# Patient Record
Sex: Male | Born: 1985 | Race: White | Hispanic: No | Marital: Single | Smoking: Never smoker
Health system: Southern US, Community
[De-identification: ages and names within clinical notes are randomized; demographics above are authoritative.]

## PROBLEM LIST (undated history)

## (undated) DIAGNOSIS — F913 Oppositional defiant disorder: Secondary | ICD-10-CM

## (undated) DIAGNOSIS — F909 Attention-deficit hyperactivity disorder, unspecified type: Secondary | ICD-10-CM

## (undated) DIAGNOSIS — F71 Moderate intellectual disabilities: Secondary | ICD-10-CM

---

## 2006-02-15 ENCOUNTER — Emergency Department: Payer: Self-pay | Admitting: Emergency Medicine

## 2008-02-28 ENCOUNTER — Emergency Department: Payer: Self-pay | Admitting: Emergency Medicine

## 2008-05-10 ENCOUNTER — Emergency Department: Payer: Self-pay | Admitting: Unknown Physician Specialty

## 2008-05-11 ENCOUNTER — Other Ambulatory Visit: Payer: Self-pay

## 2008-06-22 ENCOUNTER — Emergency Department: Payer: Self-pay | Admitting: Emergency Medicine

## 2008-07-26 ENCOUNTER — Emergency Department: Payer: Self-pay | Admitting: Emergency Medicine

## 2008-10-29 ENCOUNTER — Emergency Department: Payer: Self-pay | Admitting: Emergency Medicine

## 2009-09-26 ENCOUNTER — Emergency Department: Payer: Self-pay | Admitting: Emergency Medicine

## 2009-10-14 ENCOUNTER — Emergency Department: Payer: Self-pay | Admitting: Emergency Medicine

## 2009-10-17 ENCOUNTER — Emergency Department: Payer: Self-pay | Admitting: Emergency Medicine

## 2009-11-28 ENCOUNTER — Emergency Department: Payer: Self-pay | Admitting: Emergency Medicine

## 2009-12-20 ENCOUNTER — Emergency Department (HOSPITAL_BASED_OUTPATIENT_CLINIC_OR_DEPARTMENT_OTHER): Admission: EM | Admit: 2009-12-20 | Discharge: 2009-12-20 | Payer: Self-pay | Admitting: Emergency Medicine

## 2010-06-29 ENCOUNTER — Emergency Department: Payer: Self-pay | Admitting: Emergency Medicine

## 2010-08-08 ENCOUNTER — Ambulatory Visit: Payer: Self-pay | Admitting: Urology

## 2010-12-02 ENCOUNTER — Emergency Department: Payer: Self-pay | Admitting: Emergency Medicine

## 2010-12-30 LAB — POCT TOXICOLOGY PANEL

## 2010-12-30 LAB — CARBOXYHEMOGLOBIN
Carboxyhemoglobin: 1.5 % (ref 0.5–1.5)
Methemoglobin: 0.8 % (ref 0.0–1.5)

## 2010-12-30 LAB — POCT I-STAT 3, ART BLOOD GAS (G3+)
pCO2 arterial: 38.5 mmHg (ref 35.0–45.0)
pH, Arterial: 7.39 (ref 7.350–7.450)
pO2, Arterial: 80 mmHg (ref 80.0–100.0)

## 2010-12-30 LAB — BASIC METABOLIC PANEL
GFR calc non Af Amer: >60 mL/min (ref >60)
Glucose, Bld: 101 mg/dL — ABNORMAL HIGH (ref 70–99)
Potassium: 3.9 mEq/L (ref 3.5–5.1)
Sodium: 148 mEq/L — ABNORMAL HIGH (ref 135–145)

## 2010-12-30 LAB — DIFFERENTIAL
Basophils Absolute: 0 10*3/uL (ref 0.0–0.1)
Eosinophils Relative: 0 % (ref 0–5)
Lymphocytes Relative: 24 % (ref 12–46)
Lymphs Abs: 2.3 10*3/uL (ref 0.7–4.0)
Monocytes Absolute: 0.8 10*3/uL (ref 0.1–1.0)

## 2010-12-30 LAB — CBC
HCT: 46.5 % (ref 39.0–52.0)
Hemoglobin: 16.3 g/dL (ref 13.0–17.0)
RDW: 13.2 % (ref 11.5–15.5)

## 2010-12-30 LAB — URINALYSIS, ROUTINE W REFLEX MICROSCOPIC
Ketones, ur: 15 mg/dL — AB
Nitrite: POSITIVE — AB
Protein, ur: NEGATIVE mg/dL
Urobilinogen, UA: 1 mg/dL (ref 0.0–1.0)

## 2010-12-30 LAB — HEPATIC FUNCTION PANEL
ALT: 30 U/L (ref 0–53)
AST: 30 U/L (ref 0–37)
Bilirubin, Direct: 0 mg/dL (ref 0.0–0.3)
Total Bilirubin: 0.8 mg/dL (ref 0.3–1.2)

## 2010-12-30 LAB — URINE MICROSCOPIC-ADD ON

## 2011-05-23 IMAGING — US US RENAL KIDNEY
1 series · 17 of 25 positions shown · non-contrast
Comparison: none

REASON FOR EXAM: frequency and incontinence
COMMENTS:

[Series 1: us renal kidney · 17 of 25 slices shown]
[im 1/25]
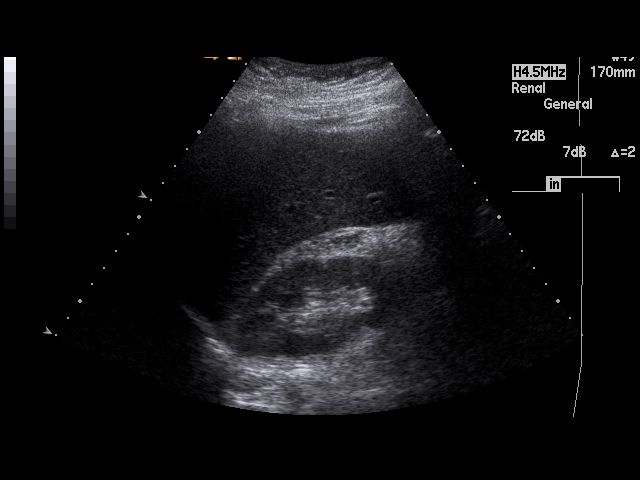
[im 3/25]
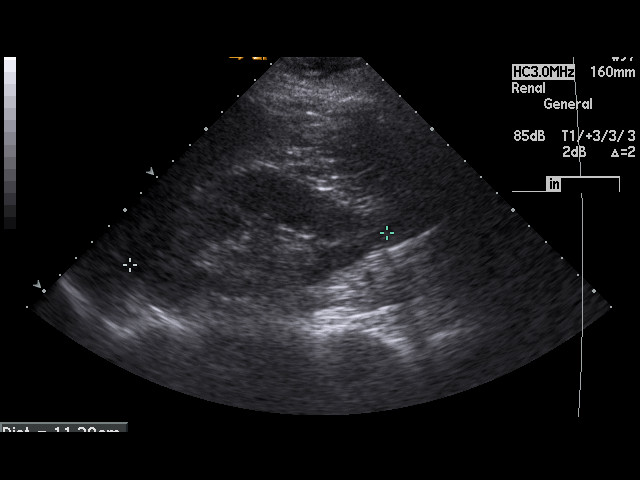
[im 4/25]
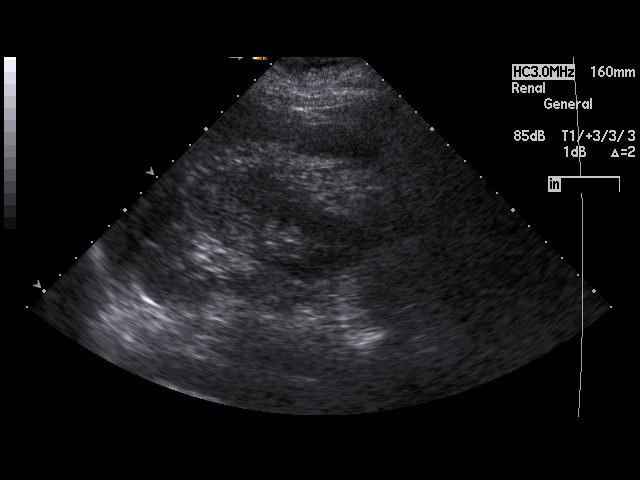
[im 6/25]
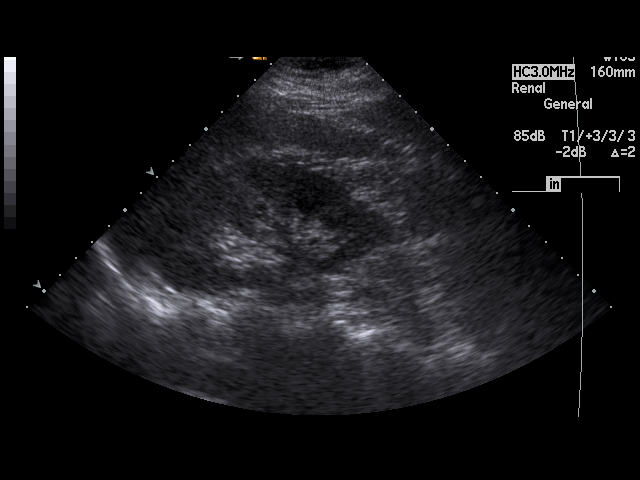
[im 7/25]
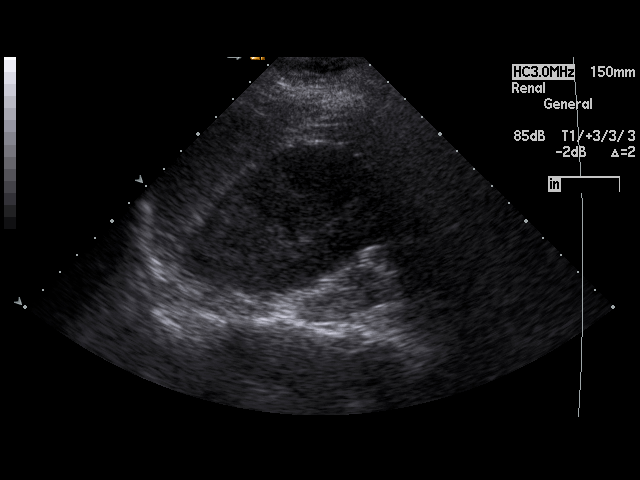
[im 9/25]
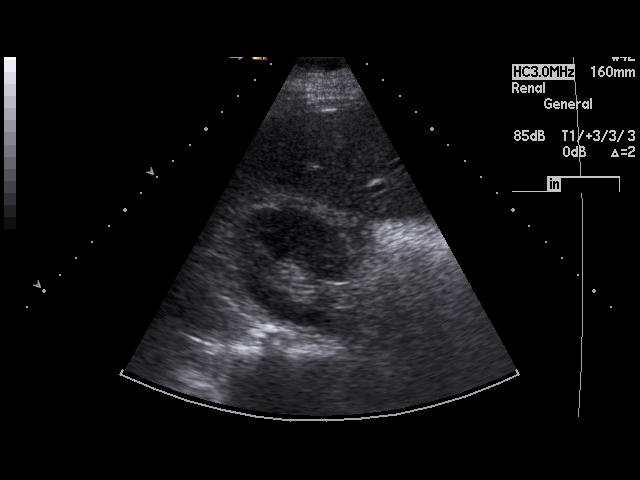
[im 10/25]
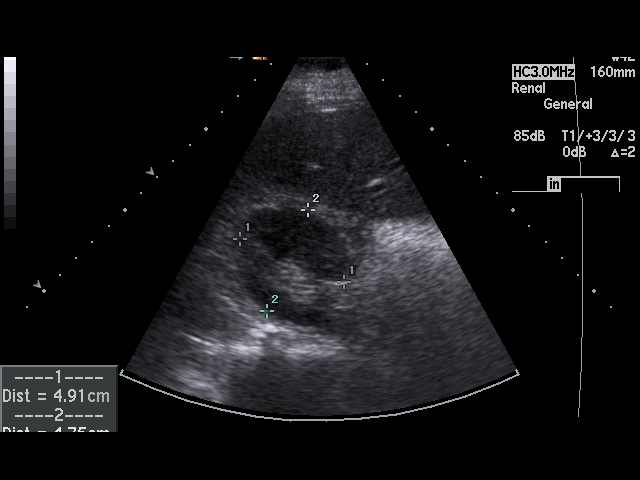
[im 12/25]
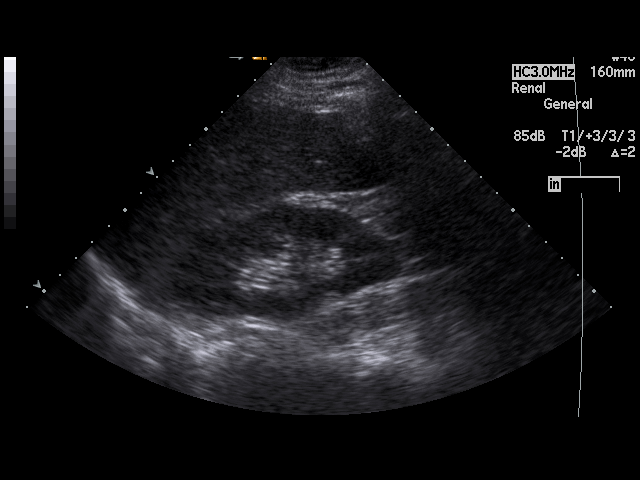
[im 13/25]
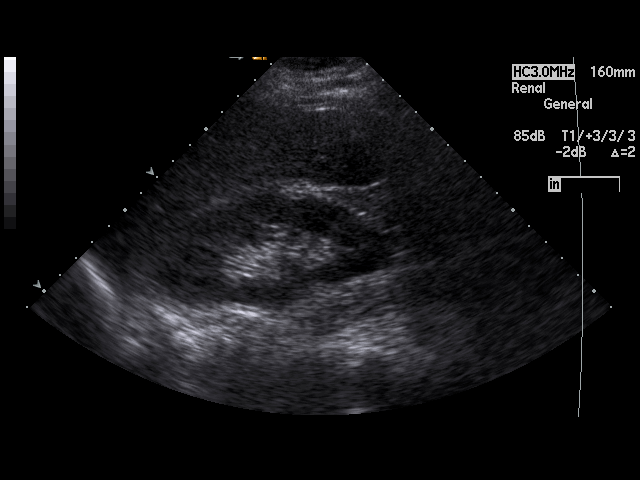
[im 14/25]
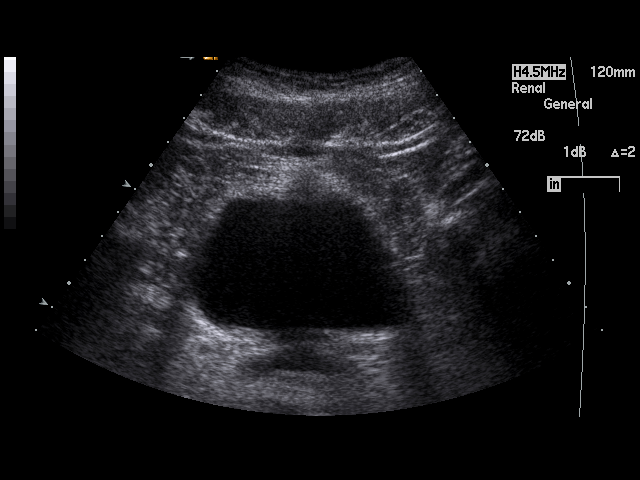
[im 16/25]
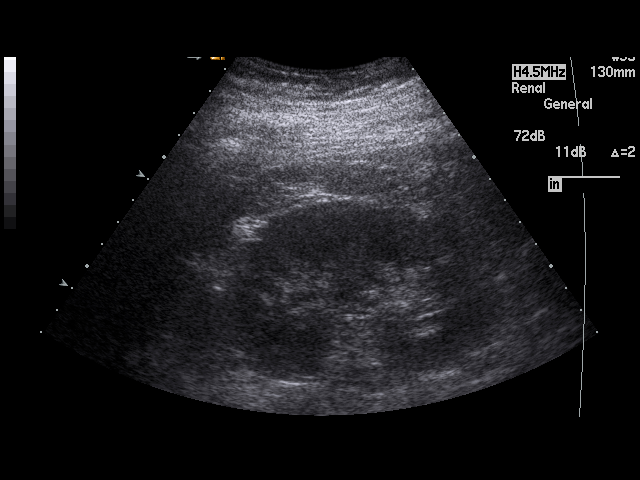
[im 17/25]
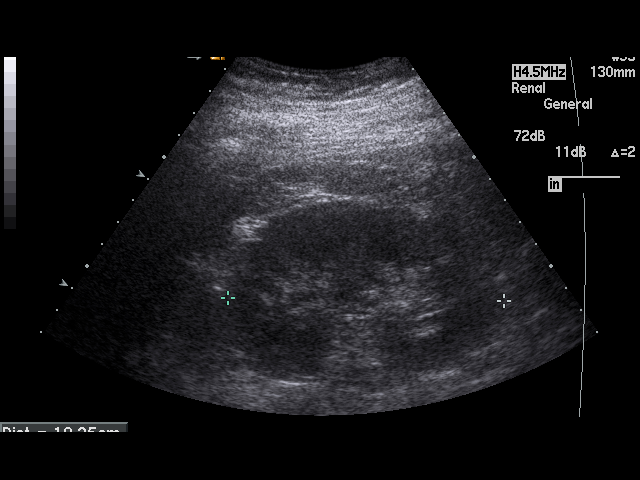
[im 19/25]
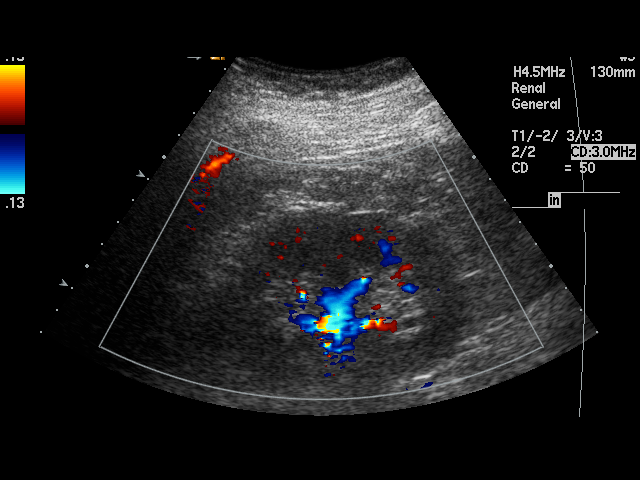
[im 20/25]
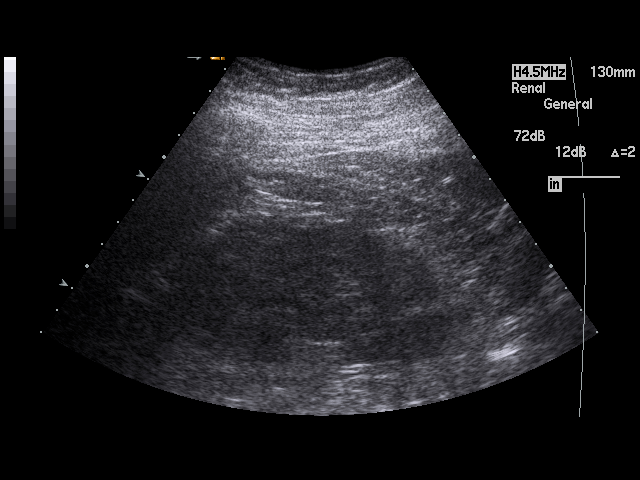
[im 22/25]
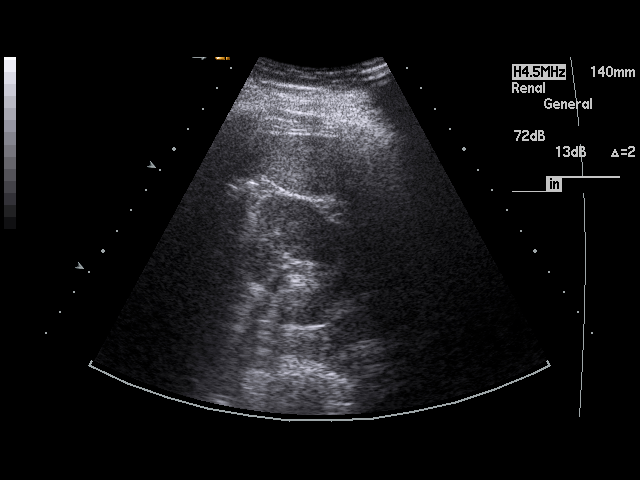
[im 23/25]
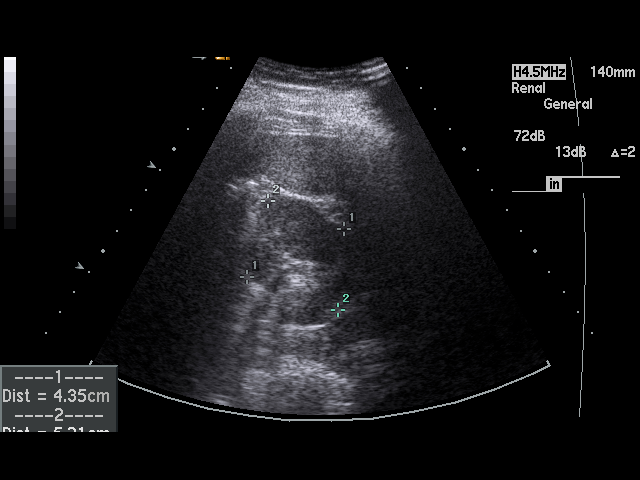
[im 25/25]
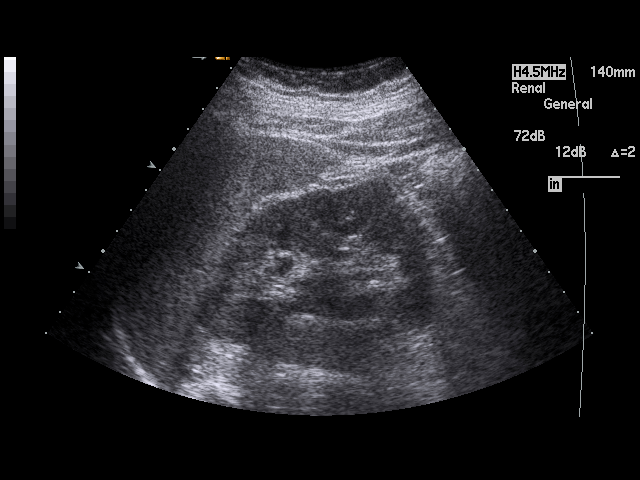

[17 of 25 positions shown; findings below may reference images not displayed]

PROCEDURE:     MESETRU - MESETRU KIDNEYS  - August 08, 2010  [DATE]

RESULT:     The right kidney measures 11.29 x 4.91 x 4.75 cm and the left
10.35 x 4.35 x 5.21 cm. There is appropriate corticomedullary
differentiation without evidence of hydronephrosis, masses or calculi. The
urinary bladder is partially distended with urine.
IMPRESSION: Unremarkable bilateral renal ultrasound.

## 2020-06-23 ENCOUNTER — Emergency Department
Admission: EM | Admit: 2020-06-23 | Discharge: 2020-06-25 | Disposition: A | Discharge status: Home / Self Care | Payer: Medicare Other | Attending: Emergency Medicine | Admitting: Emergency Medicine

## 2020-06-23 ENCOUNTER — Encounter: Payer: Self-pay | Admitting: Emergency Medicine

## 2020-06-23 ENCOUNTER — Other Ambulatory Visit: Payer: Self-pay

## 2020-06-23 DIAGNOSIS — R451 Restlessness and agitation: Secondary | ICD-10-CM | POA: Insufficient documentation

## 2020-06-23 DIAGNOSIS — F259 Schizoaffective disorder, unspecified: Secondary | ICD-10-CM | POA: Insufficient documentation

## 2020-06-23 DIAGNOSIS — Z79899 Other long term (current) drug therapy: Secondary | ICD-10-CM | POA: Diagnosis not present

## 2020-06-23 DIAGNOSIS — Z046 Encounter for general psychiatric examination, requested by authority: Secondary | ICD-10-CM | POA: Diagnosis present

## 2020-06-23 DIAGNOSIS — F6381 Intermittent explosive disorder: Secondary | ICD-10-CM

## 2020-06-23 DIAGNOSIS — F411 Generalized anxiety disorder: Secondary | ICD-10-CM

## 2020-06-23 HISTORY — DX: Attention-deficit hyperactivity disorder, unspecified type: F90.9

## 2020-06-23 HISTORY — DX: Moderate intellectual disabilities: F71

## 2020-06-23 HISTORY — DX: Oppositional defiant disorder: F91.3

## 2020-06-23 LAB — URINE DRUG SCREEN, QUALITATIVE (ARMC ONLY)
Amphetamines, Ur Screen: NOT DETECTED
Barbiturates, Ur Screen: NOT DETECTED
Benzodiazepine, Ur Scrn: NOT DETECTED
Cannabinoid 50 Ng, Ur ~~LOC~~: NOT DETECTED
Cocaine Metabolite,Ur ~~LOC~~: NOT DETECTED
MDMA (Ecstasy)Ur Screen: NOT DETECTED
Methadone Scn, Ur: NOT DETECTED
Opiate, Ur Screen: NOT DETECTED
Phencyclidine (PCP) Ur S: NOT DETECTED
Tricyclic, Ur Screen: NOT DETECTED

## 2020-06-23 LAB — CBC
HCT: 48.1 % (ref 39.0–52.0)
Hemoglobin: 17.3 g/dL — ABNORMAL HIGH (ref 13.0–17.0)
MCH: 31.6 pg (ref 26.0–34.0)
MCHC: 36 g/dL (ref 30.0–36.0)
MCV: 87.9 fL (ref 80.0–100.0)
Platelets: 188 10*3/uL (ref 150–400)
RBC: 5.47 MIL/uL (ref 4.22–5.81)
RDW: 12.4 % (ref 11.5–15.5)
WBC: 9.8 10*3/uL (ref 4.0–10.5)
nRBC: 0 % (ref 0.0–0.2)

## 2020-06-23 LAB — COMPREHENSIVE METABOLIC PANEL
ALT: 34 U/L (ref 0–44)
AST: 16 U/L (ref 15–41)
Albumin: 5.1 g/dL — ABNORMAL HIGH (ref 3.5–5.0)
Alkaline Phosphatase: 65 U/L (ref 38–126)
Anion gap: 12 (ref 5–15)
BUN: 19 mg/dL (ref 6–20)
CO2: 27 mmol/L (ref 22–32)
Calcium: 9.9 mg/dL (ref 8.9–10.3)
Chloride: 100 mmol/L (ref 98–111)
Creatinine, Ser: 0.88 mg/dL (ref 0.61–1.24)
GFR calc Af Amer: >60 mL/min (ref >60)
GFR calc non Af Amer: >60 mL/min (ref >60)
Glucose, Bld: 147 mg/dL — ABNORMAL HIGH (ref 70–99)
Potassium: 4.3 mmol/L (ref 3.5–5.1)
Sodium: 139 mmol/L (ref 135–145)
Total Bilirubin: 0.8 mg/dL (ref 0.3–1.2)
Total Protein: 8 g/dL (ref 6.5–8.1)

## 2020-06-23 LAB — ETHANOL: Alcohol, Ethyl (B): <10 mg/dL (ref <10)

## 2020-06-23 LAB — SALICYLATE LEVEL: Salicylate Lvl: <7 mg/dL — ABNORMAL LOW (ref 7.0–30.0)

## 2020-06-23 LAB — ACETAMINOPHEN LEVEL: Acetaminophen (Tylenol), Serum: <10 ug/mL — ABNORMAL LOW (ref 10–30)
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
## 2020-06-24 DIAGNOSIS — R451 Restlessness and agitation: Secondary | ICD-10-CM | POA: Diagnosis not present

## 2020-06-24 MED ORDER — DULOXETINE HCL 30 MG PO CPEP
30.0000 mg | ORAL_CAPSULE | Freq: Every day | ORAL | Status: DC
  Administered 2020-06-24 – 2020-06-25 (×2): 30 mg via ORAL
  Filled 2020-06-24 (×3): qty 1

## 2020-06-24 MED ORDER — CLONAZEPAM 0.5 MG PO TABS
0.5000 mg | ORAL_TABLET | Freq: Two times a day (BID) | ORAL | Status: DC
  Administered 2020-06-24 – 2020-06-25 (×3): 0.5 mg via ORAL
  Filled 2020-06-24 (×3): qty 1

## 2020-06-24 MED ORDER — TRIHEXYPHENIDYL HCL 2 MG PO TABS
2.0000 mg | ORAL_TABLET | Freq: Two times a day (BID) | ORAL | Status: DC
  Administered 2020-06-24 – 2020-06-25 (×2): 2 mg via ORAL
  Filled 2020-06-24 (×4): qty 1

## 2020-06-24 MED ORDER — THIOTHIXENE 2 MG PO CAPS
2.0000 mg | ORAL_CAPSULE | Freq: Two times a day (BID) | ORAL | Status: DC
  Administered 2020-06-24 – 2020-06-25 (×3): 2 mg via ORAL
  Filled 2020-06-24 (×6): qty 1

## 2020-06-24 MED ORDER — DIVALPROEX SODIUM ER 250 MG PO TB24
1000.0000 mg | ORAL_TABLET | Freq: Every day | ORAL | Status: DC
  Administered 2020-06-24: 1000 mg via ORAL
  Filled 2020-06-24: qty 4

## 2020-06-24 MED ORDER — MELATONIN 5 MG PO TABS
5.0000 mg | ORAL_TABLET | Freq: Every day | ORAL | Status: DC
  Administered 2020-06-24: 5 mg via ORAL
  Filled 2020-06-24 (×2): qty 1

## 2020-06-24 MED ORDER — OLANZAPINE 10 MG PO TABS
20.0000 mg | ORAL_TABLET | Freq: Every day | ORAL | Status: DC
  Administered 2020-06-24: 20 mg via ORAL
  Filled 2020-06-24: qty 2

## 2020-06-24 NOTE — ED Notes (Signed)
Gave pt dinner tray  

## 2020-06-24 NOTE — Progress Notes (Addendum)
Patient ID: Louis Edwards, male   DOB: 1985/11/02, 34 y.o.   MRN: [id number]    Saw patient reviewed chart I will do full consult soon  Patient with long S/A disorder With breakthrough symptoms at group home  OCD traits, increasing anxiety and psychosis  breatkthrough symptoms needing reeval of meds and possible readmission---he still claims he wants to hurt kill or harm another group home member   [facility] of [facility]  Unclear if he has had depot injectables yet   Needs more info from group home   In the meantime   Zyprexa changed to 20 mg po qhs Klonopin 0.5 mg po bid added Cymbalta am 30 mg po added Artane 2 bid with second antipsychotic due to past monotherapy failures    Navane 2 bid added as well.    Rama Candise Bowens MD

## 2020-06-24 NOTE — ED Notes (Signed)
Dr Rao at bedside 

## 2020-06-24 NOTE — ED Notes (Signed)
Pt calls this RN into room and states "I think i'd be better off dead." This RN explains to pt that he is in the hospital. Pt requests drink and crackers.

## 2020-06-24 NOTE — ED Notes (Signed)
ENVIRONMENTAL ASSESSMENT Potentially harmful objects out of patient reach: Yes.   Personal belongings secured: Yes.   Patient dressed in hospital provided attire only: Yes.   Plastic bags out of patient reach: Yes.   Patient care equipment (cords, cables, call bells, lines, and drains) shortened, removed, or accounted for: Yes.   Equipment and supplies removed from bottom of stretcher: Yes.   Potentially toxic materials out of patient reach: Yes.   Sharps container removed or out of patient reach: Yes.      Pt. Alert and oriented, warm and dry, in no distress. Pt. Denies SI, HI, and AVH. Pt. Encouraged to let nursing staff know of any concerns or needs.   
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
## 2020-06-25 DIAGNOSIS — R451 Restlessness and agitation: Secondary | ICD-10-CM | POA: Diagnosis not present

## 2020-06-25 MED ORDER — THIOTHIXENE 2 MG PO CAPS: 2.0000 mg | ORAL_CAPSULE | Freq: Two times a day (BID) | ORAL | 0 refills | Status: DC

## 2020-06-25 MED ORDER — TRIHEXYPHENIDYL HCL 2 MG PO TABS: 2.0000 mg | ORAL_TABLET | Freq: Two times a day (BID) | ORAL | 0 refills | Status: DC

## 2020-06-25 MED ORDER — DULOXETINE HCL 30 MG PO CPEP: 30.0000 mg | ORAL_CAPSULE | Freq: Every day | ORAL | 0 refills | Status: DC

## 2020-06-25 MED ORDER — LITHIUM CARBONATE 150 MG PO CAPS
150.0000 mg | ORAL_CAPSULE | Freq: Two times a day (BID) | ORAL | Status: DC
  Filled 2020-06-25: qty 1

## 2020-06-25 MED ORDER — DIVALPROEX SODIUM ER 500 MG PO TB24: 1000.0000 mg | ORAL_TABLET | Freq: Every day | ORAL | 0 refills | Status: DC

## 2020-06-25 MED ORDER — CLONAZEPAM 0.5 MG PO TABS: 0.5000 mg | ORAL_TABLET | Freq: Two times a day (BID) | ORAL | 0 refills | Status: DC

## 2020-06-25 MED ORDER — LITHIUM CARBONATE 150 MG PO CAPS: 150.0000 mg | ORAL_CAPSULE | Freq: Two times a day (BID) | ORAL | 0 refills | Status: DC

## 2020-06-25 MED ORDER — OLANZAPINE 20 MG PO TABS: 20.0000 mg | ORAL_TABLET | Freq: Every day | ORAL | 0 refills | Status: DC

## 2020-06-25 NOTE — ED Notes (Signed)
Patient denies SI/HI. Calm and cooperative at this time

## 2020-06-25 NOTE — ED Notes (Signed)
Pt asleep, breakfast tray placed in rm.  

## 2020-06-25 NOTE — Final Progress Note (Addendum)
Physician Final Progress Note  Patient ID: Louis Edwards MRN: [id number] DOB/AGE: May 27, 1986 34 y.o.  Admit date: 06/23/2020 Admitting provider: No admitting provider for patient encounter. Discharge date: 06/25/2020   Admission Diagnoses:     IED  Schizoaffective Disorder  Generalized Anxiety    Discharge Diagnoses:  Active Problems:   * No active hospital problems. *  Same   Consults:    TTS / Psych MD and ER MD    Significant Findings/ Diagnostic Studies:  Procedures:   General   Discharge Condition: \fair    Disposition:  Back to group home  I called the guardian his Mom who approved and also the meds  And also JohnGrovess at group home [phone]  Diet:  Regular   Discharge Activity:  As tolerated at group home encourage exercise hobbies    Discharge Meds   Navane 2 mg po bid  60 Depakote ER 1000mg  po qhs 30 Klonopin 0.5 mg po bid 30  Zyprexa  20 mg po qhs 30 Cymbalta 30 daily 30 Lithium carbonate 150 mg po bid 60  Needs levels of depakote and lithium in one week and med mgt appointment in one week for follow up  No side effects or medical problems at discharge    He came initially due to explosive possiblity and he was trying to not do this (improvement) aggravated by roommate --and was anxious   He came voluntarily meds adjusted and added   Guardian his Mom is aware with approval  See above for list   He calmed down and decided to go back and apologize to group mate  He feels better on the med changes so far and with respite here     Alert cooperative caucasian male  Oriented to person place date and time  Consciousness not clouded or fluctuant No shakes tics tremors No other movement problems Concentration and attention better Sleep improved He has less OCD and less severe anxiety Less neediness and following around calm in room on his own  Though process and content --no frank severe psychosis or mania or thoughts to harm self or  others  Memory no other new change Fund of knowledge intelligence limited Judgement insight reliability improving No SI Or  HI contracts for safety  Speech --more coherent less frantic Abstraction somewhat better Rapport and eye contact better   Leans not known  Handedness not known Akathisia none Musculoskeletal normal  Gait and station normal Psychomotor normal Assets ---supportive guardian and group home He is trying to hold temper ADL's---okay Aims not done Sleep  Improving Language  English normal Cognition waxes and wanes with stress and agitation now improved          A/p  Diagnoses as above  Group home said they would pick up when called  Guardian his mom approved the whole plan when I called her directly   We have no TTS scheduled today               Total time spent taking care of this patient: 30- 40 minutes  Signed: 06/25/2020, 10:27 AM

## 2020-06-25 NOTE — ED Notes (Signed)
Vol/pending re-eval.

## 2020-06-25 NOTE — ED Notes (Signed)
Gave pt lunch tray with juice. 

## 2020-06-25 NOTE — ED Provider Notes (Signed)
Current Outpatient Medications (Other):  .  clonazePAM (KLONOPIN) 0.5 MG tablet, Take 1 tablet (0.5 mg total) by mouth 2 (two) times daily for 7 days. .  divalproex (DEPAKOTE ER) 500 MG 24 hr tablet, Take 2 tablets (1,000 mg total) by mouth at bedtime. .  DULoxetine (CYMBALTA) 30 MG capsule, Take 1 capsule (30 mg total) by mouth daily. Marland Kitchen  lithium carbonate 150 MG capsule, Take 1 capsule (150 mg total) by mouth 2 (two) times daily with a meal. .  OLANZapine (ZYPREXA) 20 MG tablet, Take 1 tablet (20 mg total) by mouth at bedtime. Marland Kitchen  thiothixene (NAVANE) 2 MG capsule, Take 1 capsule (2 mg total) by mouth 2 (two) times daily. .  trihexyphenidyl (ARTANE) 2 MG tablet, Take 1 tablet (2 mg total) by mouth 2 (two) times daily with a meal.    Sharman Cheek, MD 06/25/20 1311

## 2020-06-25 NOTE — ED Provider Notes (Signed)
Procedures     ----------------------------------------- 11:33 AM on 06/25/2020 -----------------------------------------   Patient symptoms have been stabilized on medication regimen here in the ED under the care of psychiatry.  Prescriptions have been written for his medication regimen per psychiatry recommendations.  His guardians been contacted by psychiatry who agrees with outpatient plan.  Instructions relayed on needing follow-up in a week for medication management as well as checking lithium and Depakote levels.   Sharman Cheek, MD 06/25/20 1134

## 2020-06-25 NOTE — ED Provider Notes (Signed)
Emergency Medicine Observation Re-evaluation Note  Louis Edwards is a 34 y.o. male, seen on rounds today.  Pt initially presented to the ED for complaints of Psychiatric Evaluation Currently, the patient is sleeping.  Physical Exam  BP 135/84 (BP Location: Right Arm)   Pulse 93   Temp 97.7 F (36.5 C) (Oral)   Resp 18   Ht 1.651 m (5\' 5" )   Wt 90.7 kg   SpO2 96%   BMI 33.28 kg/m  Physical Exam Gen:  No acute distress Resp:  Breathing easily and comfortably, no accessory muscle usage Neuro:  Moving all four extremities, no gross focal neuro deficits Psych:  Resting currently, calm and cooperative when awake  ED Course / MDM  EKG:    I have reviewed the labs performed to date as well as medications administered while in observation.  Recent changes in the last 24 hours include evaluation by Dr. .  Plan  Current plan is for psych reassessment in the AM. Patient is not under full IVC at this time.   Smith Robert, MD 06/25/20 [phone]

## 2020-06-25 NOTE — Discharge Instructions (Addendum)
While at the hospital, the psychiatrist started some new medications which greatly improved your symptoms. Please continue these medications and follow up with your psychiatrist in one week.    At that visit, your medications can be reassessed and reordered, and you will need to have blood tests to check your lithium and depakote blood levels.

## 2020-06-25 NOTE — ED Notes (Signed)
Patient walked to [employer] car (group home representative)

## 2020-06-29 ENCOUNTER — Encounter: Payer: Self-pay | Admitting: Emergency Medicine

## 2020-06-29 ENCOUNTER — Other Ambulatory Visit: Payer: Self-pay

## 2020-06-29 ENCOUNTER — Emergency Department
Admission: EM | Admit: 2020-06-29 | Discharge: 2020-06-30 | Disposition: A | Discharge status: Still In Hospital | Payer: Medicare Other | Attending: Emergency Medicine | Admitting: Emergency Medicine

## 2020-06-29 DIAGNOSIS — F259 Schizoaffective disorder, unspecified: Secondary | ICD-10-CM | POA: Insufficient documentation

## 2020-06-29 DIAGNOSIS — F79 Unspecified intellectual disabilities: Secondary | ICD-10-CM | POA: Diagnosis not present

## 2020-06-29 DIAGNOSIS — F6381 Intermittent explosive disorder: Secondary | ICD-10-CM | POA: Insufficient documentation

## 2020-06-29 MED ORDER — TRIHEXYPHENIDYL HCL 2 MG PO TABS
2.0000 mg | ORAL_TABLET | Freq: Two times a day (BID) | ORAL | Status: DC
  Filled 2020-06-29 (×2): qty 1

## 2020-06-29 MED ORDER — ALUM & MAG HYDROXIDE-SIMETH 200-200-20 MG/5ML PO SUSP: 30.0000 mL | Freq: Four times a day (QID) | ORAL | Status: DC | PRN

## 2020-06-29 MED ORDER — DIVALPROEX SODIUM ER 250 MG PO TB24: 1000.0000 mg | ORAL_TABLET | Freq: Every day | ORAL | Status: DC

## 2020-06-29 MED ORDER — LITHIUM CARBONATE 150 MG PO CAPS
150.0000 mg | ORAL_CAPSULE | Freq: Two times a day (BID) | ORAL | Status: DC
  Filled 2020-06-29 (×2): qty 1

## 2020-06-29 MED ORDER — DULOXETINE HCL 30 MG PO CPEP
30.0000 mg | ORAL_CAPSULE | Freq: Every day | ORAL | Status: DC
  Filled 2020-06-29 (×2): qty 1

## 2020-06-29 MED ORDER — ONDANSETRON HCL 4 MG PO TABS: 4.0000 mg | ORAL_TABLET | Freq: Three times a day (TID) | ORAL | Status: DC | PRN

## 2020-06-29 MED ORDER — OLANZAPINE 10 MG PO TABS: 20.0000 mg | ORAL_TABLET | Freq: Every day | ORAL | Status: DC

## 2020-06-29 MED ORDER — THIOTHIXENE 2 MG PO CAPS
2.0000 mg | ORAL_CAPSULE | Freq: Two times a day (BID) | ORAL | Status: DC
  Filled 2020-06-29 (×2): qty 1

## 2020-06-29 MED ORDER — ACETAMINOPHEN 325 MG PO TABS: 650.0000 mg | ORAL_TABLET | ORAL | Status: DC | PRN

## 2020-06-29 MED ORDER — OLANZAPINE 5 MG PO TBDP
10.0000 mg | ORAL_TABLET | Freq: Once | ORAL | Status: AC
  Administered 2020-06-29: 10 mg via ORAL
  Filled 2020-06-29: qty 2

## 2020-06-29 MED ORDER — LITHIUM CARBONATE 150 MG PO CAPS
150.0000 mg | ORAL_CAPSULE | Freq: Two times a day (BID) | ORAL | Status: DC
  Filled 2020-06-29: qty 1

## 2020-06-29 MED ORDER — DULOXETINE HCL 30 MG PO CPEP
30.0000 mg | ORAL_CAPSULE | Freq: Every day | ORAL | Status: DC
  Filled 2020-06-29: qty 1

## 2020-06-29 MED ORDER — CLONAZEPAM 0.5 MG PO TABS: 0.5000 mg | ORAL_TABLET | Freq: Two times a day (BID) | ORAL | Status: DC | PRN
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
## 2020-06-30 DIAGNOSIS — F6381 Intermittent explosive disorder: Secondary | ICD-10-CM | POA: Diagnosis not present

## 2020-06-30 MED ORDER — LITHIUM CARBONATE 150 MG PO CAPS
150.0000 mg | ORAL_CAPSULE | Freq: Two times a day (BID) | ORAL | Status: DC
  Filled 2020-06-30: qty 1

## 2020-06-30 MED ORDER — HYDROXYZINE HCL 25 MG PO TABS: 25.0000 mg | ORAL_TABLET | Freq: Four times a day (QID) | ORAL | 0 refills | Status: AC | PRN
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
## 2020-06-30 NOTE — ED Notes (Signed)
Hourly rounding reveals patient in awake room. No complaints, stable, in no acute distress. Q15 minute rounds and monitoring via Psychologist, counselling to continue.

## 2020-06-30 NOTE — ED Notes (Signed)
Hourly rounding reveals patient asleep in room. No complaints, stable, in no acute distress. Q15 minute rounds and monitoring via Rover and Officer to continue.  

## 2020-06-30 NOTE — ED Notes (Signed)
Hourly rounding reveals patient awake in room. No complaints, stable, in no acute distress. Q15 minute rounds and monitoring via Rover and Officer to continue.  
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
## 2020-06-30 NOTE — ED Notes (Signed)
Pharmacy contacted in regards to pt meds
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
## 2020-06-30 NOTE — ED Notes (Signed)
Pt in room repeatedly asking if he is going to jail and wants to go to jail
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
## 2020-06-30 NOTE — ED Notes (Signed)
Pt now speaking to [facility]
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
## 2020-06-30 NOTE — ED Notes (Signed)
Pt. Transferred from Triage to room after dressing out and screening for contraband. Report to include Situation, Background, Assessment and Recommendations from RN. Pt. Oriented to Quad including Q15 minute rounds as well as Psychologist, counselling for their protection. Patient is alert and oriented, warm and dry in no acute distress. Patient denies AVH. Pt. Encouraged to let this nurse know if needs arise.
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
## 2020-07-02 ENCOUNTER — Other Ambulatory Visit: Payer: Self-pay

## 2020-07-02 ENCOUNTER — Emergency Department
Admission: EM | Admit: 2020-07-02 | Discharge: 2020-07-03 | Disposition: A | Discharge status: Home / Self Care | Payer: Medicare Other | Attending: Emergency Medicine | Admitting: Emergency Medicine

## 2020-07-02 DIAGNOSIS — R451 Restlessness and agitation: Secondary | ICD-10-CM | POA: Diagnosis present

## 2020-07-02 DIAGNOSIS — Z79899 Other long term (current) drug therapy: Secondary | ICD-10-CM | POA: Insufficient documentation

## 2020-07-02 DIAGNOSIS — F259 Schizoaffective disorder, unspecified: Secondary | ICD-10-CM | POA: Diagnosis not present

## 2020-07-02 DIAGNOSIS — R456 Violent behavior: Secondary | ICD-10-CM | POA: Insufficient documentation

## 2020-07-02 DIAGNOSIS — Z20822 Contact with and (suspected) exposure to covid-19: Secondary | ICD-10-CM | POA: Insufficient documentation

## 2020-07-02 DIAGNOSIS — R4689 Other symptoms and signs involving appearance and behavior: Secondary | ICD-10-CM

## 2020-07-02 LAB — CBC
HCT: 46.9 % (ref 39.0–52.0)
Hemoglobin: 17.4 g/dL — ABNORMAL HIGH (ref 13.0–17.0)
MCH: 31.6 pg (ref 26.0–34.0)
MCHC: 37.1 g/dL — ABNORMAL HIGH (ref 30.0–36.0)
MCV: 85.3 fL (ref 80.0–100.0)
Platelets: 182 10*3/uL (ref 150–400)
RBC: 5.5 MIL/uL (ref 4.22–5.81)
RDW: 12.4 % (ref 11.5–15.5)
WBC: 10.2 10*3/uL (ref 4.0–10.5)
nRBC: 0 % (ref 0.0–0.2)

## 2020-07-02 LAB — COMPREHENSIVE METABOLIC PANEL
ALT: 37 U/L (ref 0–44)
AST: 25 U/L (ref 15–41)
Albumin: 4.5 g/dL (ref 3.5–5.0)
Alkaline Phosphatase: 71 U/L (ref 38–126)
Anion gap: 12 (ref 5–15)
BUN: 13 mg/dL (ref 6–20)
CO2: 21 mmol/L — ABNORMAL LOW (ref 22–32)
Calcium: 9.7 mg/dL (ref 8.9–10.3)
Chloride: 107 mmol/L (ref 98–111)
Creatinine, Ser: 0.88 mg/dL (ref 0.61–1.24)
GFR calc Af Amer: >60 mL/min (ref >60)
GFR calc non Af Amer: >60 mL/min (ref >60)
Glucose, Bld: 104 mg/dL — ABNORMAL HIGH (ref 70–99)
Potassium: 4.2 mmol/L (ref 3.5–5.1)
Sodium: 140 mmol/L (ref 135–145)
Total Bilirubin: 0.7 mg/dL (ref 0.3–1.2)
Total Protein: 7.7 g/dL (ref 6.5–8.1)

## 2020-07-02 LAB — ACETAMINOPHEN LEVEL: Acetaminophen (Tylenol), Serum: <10 ug/mL — ABNORMAL LOW (ref 10–30)

## 2020-07-02 LAB — RESPIRATORY PANEL BY RT PCR (FLU A&B, COVID)
Influenza A by PCR: NEGATIVE
Influenza B by PCR: NEGATIVE
SARS Coronavirus 2 by RT PCR: NEGATIVE

## 2020-07-02 LAB — SALICYLATE LEVEL: Salicylate Lvl: <7 mg/dL — ABNORMAL LOW (ref 7.0–30.0)

## 2020-07-02 LAB — URINE DRUG SCREEN, QUALITATIVE (ARMC ONLY)
Amphetamines, Ur Screen: NOT DETECTED
Barbiturates, Ur Screen: NOT DETECTED
Benzodiazepine, Ur Scrn: NOT DETECTED
Cannabinoid 50 Ng, Ur ~~LOC~~: NOT DETECTED
Cocaine Metabolite,Ur ~~LOC~~: NOT DETECTED
MDMA (Ecstasy)Ur Screen: NOT DETECTED
Methadone Scn, Ur: NOT DETECTED
Opiate, Ur Screen: NOT DETECTED
Phencyclidine (PCP) Ur S: NOT DETECTED
Tricyclic, Ur Screen: NOT DETECTED

## 2020-07-02 LAB — ETHANOL: Alcohol, Ethyl (B): <10 mg/dL (ref <10)

## 2020-07-02 MED ORDER — HYDROXYZINE HCL 25 MG PO TABS: 25.0000 mg | ORAL_TABLET | Freq: Four times a day (QID) | ORAL | Status: DC | PRN

## 2020-07-02 MED ORDER — TRIHEXYPHENIDYL HCL 2 MG PO TABS
2.0000 mg | ORAL_TABLET | Freq: Two times a day (BID) | ORAL | Status: DC
  Administered 2020-07-03: 2 mg via ORAL
  Filled 2020-07-02 (×3): qty 1

## 2020-07-02 MED ORDER — OLANZAPINE 10 MG PO TABS
20.0000 mg | ORAL_TABLET | Freq: Every day | ORAL | Status: DC
  Administered 2020-07-02: 20 mg via ORAL
  Filled 2020-07-02: qty 2

## 2020-07-02 MED ORDER — LITHIUM CARBONATE 150 MG PO CAPS
150.0000 mg | ORAL_CAPSULE | Freq: Two times a day (BID) | ORAL | Status: DC
  Administered 2020-07-03: 150 mg via ORAL
  Filled 2020-07-02 (×3): qty 1

## 2020-07-02 MED ORDER — CLONAZEPAM 0.5 MG PO TABS
0.5000 mg | ORAL_TABLET | Freq: Two times a day (BID) | ORAL | Status: DC
  Administered 2020-07-02 – 2020-07-03 (×2): 0.5 mg via ORAL
  Filled 2020-07-02 (×2): qty 1

## 2020-07-02 MED ORDER — DULOXETINE HCL 30 MG PO CPEP
30.0000 mg | ORAL_CAPSULE | Freq: Every day | ORAL | Status: DC
  Administered 2020-07-02 – 2020-07-03 (×2): 30 mg via ORAL
  Filled 2020-07-02 (×2): qty 1

## 2020-07-02 MED ORDER — THIOTHIXENE 2 MG PO CAPS
2.0000 mg | ORAL_CAPSULE | Freq: Two times a day (BID) | ORAL | Status: DC
  Administered 2020-07-02 – 2020-07-03 (×2): 2 mg via ORAL
  Filled 2020-07-02 (×4): qty 1

## 2020-07-02 MED ORDER — DIVALPROEX SODIUM ER 500 MG PO TB24
1000.0000 mg | ORAL_TABLET | Freq: Every day | ORAL | Status: DC
  Administered 2020-07-02: 1000 mg via ORAL
  Filled 2020-07-02: qty 2

## 2020-07-02 NOTE — ED Notes (Signed)
Hourly rounding reveals patient asleep in room. No complaints, stable, in no acute distress. Q15 minute rounds and monitoring via Security Cameras to continue. 
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
## 2020-07-02 NOTE — ED Notes (Signed)
Pt. Transferred to [facility] from ED to room 3 after screening for contraband. Report to include Situation, Background, Assessment and Recommendations from RN. Pt. Oriented to unit including Q15 minute rounds as well as the security cameras for their protection. Patient is alert and oriented, warm and dry in no acute distress. Patient denies SI and AVH. Pt. Encouraged to let this nurse know if needs arise.

## 2020-07-02 NOTE — ED Notes (Signed)
Pt dressed out in hospital attire. Pt's belongings to include: 1 black shorts 1 black shirt 1 white underwear 2 brown sandals 2 black socks
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
## 2020-07-02 NOTE — ED Notes (Signed)

## 2020-07-02 NOTE — ED Notes (Signed)
TTS to bedside. 

## 2020-07-02 NOTE — ED Notes (Signed)
Pt requests home meds and meds to assist with sleep. MD notified.

## 2020-07-02 NOTE — ED Notes (Signed)
IVC, chart with MD for eval

## 2020-07-02 NOTE — ED Notes (Signed)
Hourly rounding reveals patient awake in day room. No complaints, stable, in no acute distress. Q15 minute rounds and monitoring via Security Cam[employer] to continue. 

## 2020-07-02 NOTE — ED Triage Notes (Signed)
First Nurse Note:  Arrives with [employer] for ED evaluation for aggressive behavior.  Patient is AAOx3.  Calm and cooperative at this time.

## 2020-07-03 DIAGNOSIS — R456 Violent behavior: Secondary | ICD-10-CM | POA: Diagnosis not present

## 2020-07-03 MED ORDER — THIOTHIXENE 5 MG PO CAPS: 5.0000 mg | ORAL_CAPSULE | Freq: Every day | ORAL | Status: DC

## 2020-07-03 MED ORDER — CLONAZEPAM 0.5 MG PO TABS
0.5000 mg | ORAL_TABLET | Freq: Three times a day (TID) | ORAL | Status: DC
  Administered 2020-07-03: 0.5 mg via ORAL
  Filled 2020-07-03: qty 1

## 2020-07-03 NOTE — ED Notes (Signed)
Hourly rounding reveals patient asleep in room. No complaints, stable, in no acute distress. Q15 minute rounds and monitoring via Security Cameras to continue. 

## 2020-07-03 NOTE — ED Notes (Signed)
Patient has been coming to door asking same questions repeatedly about when he will be discharged, his guardian came to visit and visit went well, and patient is more discontent, nurse did talk to him and let him know that Dr Smith Robert was aware that he wanted to leave and the Doctor would be back to talk to him this afternoon and asking over and over would not speed the process up, he said ok and then ask to take a shower. Staff will continue to monitor.
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
## 2020-07-03 NOTE — ED Notes (Signed)
MR [place] legal guardian called and made aware that patient will be discharged. As per legal guardian will be here in an hour to pick patient up.
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
## 2020-07-03 NOTE — ED Provider Notes (Signed)
Emergency Medicine Observation Re-evaluation Note  Louis Edwards is a 34 y.o. male, seen on rounds today.  Pt initially presented to the ED for complaints of IVC Currently, the patient is lying in bed, denies any complaints.  Physical Exam  BP (!) 137/100 (BP Location: Left Arm)   Pulse 90   Temp 98.7 F (37.1 C) (Oral)   Resp 17   Ht 5\' 5"  (1.651 m)   Wt 90.7 kg   SpO2 92%   BMI 33.27 kg/m  Physical Exam Constitutional: Resting comfortably. Eyes: Conjunctivae are normal. Head: Atraumatic. Nose: No congestion/rhinnorhea. Mouth/Throat: Mucous membranes are moist. Neck: Normal ROM Cardiovascular: No cyanosis noted. Respiratory: Normal respiratory effort. Gastrointestinal: Non-distended. Genitourinary: deferred Musculoskeletal: No lower extremity tenderness nor edema. Neurologic:  Normal speech and language. No gross focal neurologic deficits are appreciated. Skin:  Skin is warm, dry and intact. No rash noted.    ED Course / MDM  EKG:    I have reviewed the labs performed to date as well as medications administered while in observation.  Recent changes in the last 24 hours include none.  Plan  Current plan is for reevaluation by psychiatry to determine appropriate disposition. Patient is under full IVC at this time.   , MD 07/03/20 [phone]

## 2020-07-03 NOTE — BH Assessment (Signed)
TTS spoke to Thomes Dinning [phone]) who confirmed pt to be a resident in his [facility]. 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 [name] confirmed pt's ability to return to the [facility] upon discharge but is currently requesting a stronger PRN for pt due to his current one being ineffective. [name] identified the medication to be Hydroxyzine tab 25mg . [name] confirmed pt to have an scheduled appointment with his psychiatrist on 07/10/20. [name] expressed no further concerns for pt and agreed to the current disposition.   Per Dr. 09/09/20 pt will be discharged back to his [facility] pending consent from guardian

## 2020-07-03 NOTE — ED Notes (Signed)
VOL, pend D/C to Group Home
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
## 2020-07-03 NOTE — ED Provider Notes (Signed)
-----------------------------------------   3:03 PM on 07/03/2020 -----------------------------------------  Patient has been cleared by psychiatry for discharge back to group home, IVC was rescinded by Dr. Smith Robert.  Patient is eager to return to the group home and does not appear to be a threat to himself or others.   Chesley Noon, MD 07/03/20 [phone]

## 2020-07-07 ENCOUNTER — Emergency Department: Payer: Medicare Other

## 2020-07-07 ENCOUNTER — Emergency Department
Admission: EM | Admit: 2020-07-07 | Discharge: 2020-07-09 | Disposition: A | Discharge status: Home / Self Care | Payer: Medicare Other | Attending: Emergency Medicine | Admitting: Emergency Medicine

## 2020-07-07 ENCOUNTER — Other Ambulatory Visit: Payer: Self-pay

## 2020-07-07 ENCOUNTER — Encounter: Payer: Self-pay | Admitting: Emergency Medicine

## 2020-07-07 DIAGNOSIS — Z20822 Contact with and (suspected) exposure to covid-19: Secondary | ICD-10-CM | POA: Diagnosis not present

## 2020-07-07 DIAGNOSIS — Z79899 Other long term (current) drug therapy: Secondary | ICD-10-CM | POA: Diagnosis not present

## 2020-07-07 DIAGNOSIS — E860 Dehydration: Secondary | ICD-10-CM | POA: Insufficient documentation

## 2020-07-07 DIAGNOSIS — F69 Unspecified disorder of adult personality and behavior: Secondary | ICD-10-CM | POA: Insufficient documentation

## 2020-07-07 DIAGNOSIS — F6381 Intermittent explosive disorder: Secondary | ICD-10-CM

## 2020-07-07 DIAGNOSIS — R000 Tachycardia, unspecified: Secondary | ICD-10-CM | POA: Insufficient documentation

## 2020-07-07 LAB — URINE DRUG SCREEN, QUALITATIVE (ARMC ONLY)
Amphetamines, Ur Screen: NOT DETECTED
Barbiturates, Ur Screen: NOT DETECTED
Benzodiazepine, Ur Scrn: NOT DETECTED
Cannabinoid 50 Ng, Ur ~~LOC~~: NOT DETECTED
Cocaine Metabolite,Ur ~~LOC~~: NOT DETECTED
MDMA (Ecstasy)Ur Screen: NOT DETECTED
Methadone Scn, Ur: NOT DETECTED
Opiate, Ur Screen: NOT DETECTED
Phencyclidine (PCP) Ur S: NOT DETECTED
Tricyclic, Ur Screen: NOT DETECTED

## 2020-07-07 LAB — URINALYSIS, COMPLETE (UACMP) WITH MICROSCOPIC
Bilirubin Urine: NEGATIVE
Glucose, UA: NEGATIVE mg/dL
Hgb urine dipstick: NEGATIVE
Ketones, ur: 5 mg/dL — AB
Leukocytes,Ua: NEGATIVE
Nitrite: NEGATIVE
Protein, ur: 100 mg/dL — AB
Specific Gravity, Urine: 1.027 (ref 1.005–1.030)
Squamous Epithelial / HPF: NONE SEEN (ref 0–5)
pH: 5 (ref 5.0–8.0)

## 2020-07-07 LAB — CBC
HCT: 49.2 % (ref 39.0–52.0)
Hemoglobin: 17.4 g/dL — ABNORMAL HIGH (ref 13.0–17.0)
MCH: 31.2 pg (ref 26.0–34.0)
MCHC: 35.4 g/dL (ref 30.0–36.0)
MCV: 88.3 fL (ref 80.0–100.0)
Platelets: 206 10*3/uL (ref 150–400)
RBC: 5.57 MIL/uL (ref 4.22–5.81)
RDW: 12.9 % (ref 11.5–15.5)
WBC: 12.3 10*3/uL — ABNORMAL HIGH (ref 4.0–10.5)
nRBC: 0 % (ref 0.0–0.2)

## 2020-07-07 LAB — COMPREHENSIVE METABOLIC PANEL
ALT: 36 U/L (ref 0–44)
AST: 34 U/L (ref 15–41)
Albumin: 5.3 g/dL — ABNORMAL HIGH (ref 3.5–5.0)
Alkaline Phosphatase: 74 U/L (ref 38–126)
Anion gap: 12 (ref 5–15)
BUN: 12 mg/dL (ref 6–20)
CO2: 23 mmol/L (ref 22–32)
Calcium: 10.2 mg/dL (ref 8.9–10.3)
Chloride: 105 mmol/L (ref 98–111)
Creatinine, Ser: 1.06 mg/dL (ref 0.61–1.24)
GFR calc Af Amer: >60 mL/min (ref >60)
GFR calc non Af Amer: >60 mL/min (ref >60)
Glucose, Bld: 108 mg/dL — ABNORMAL HIGH (ref 70–99)
Potassium: 4.2 mmol/L (ref 3.5–5.1)
Sodium: 140 mmol/L (ref 135–145)
Total Bilirubin: 1 mg/dL (ref 0.3–1.2)
Total Protein: 8.8 g/dL — ABNORMAL HIGH (ref 6.5–8.1)

## 2020-07-07 LAB — LACTIC ACID, PLASMA
Lactic Acid, Venous: 2.3 mmol/L (ref 0.5–1.9)
Lactic Acid, Venous: 1.7 mmol/L (ref 0.5–1.9)

## 2020-07-07 LAB — ACETAMINOPHEN LEVEL: Acetaminophen (Tylenol), Serum: <10 ug/mL — ABNORMAL LOW (ref 10–30)

## 2020-07-07 LAB — VALPROIC ACID LEVEL: Valproic Acid Lvl: 32 ug/mL — ABNORMAL LOW (ref 50.0–100.0)

## 2020-07-07 LAB — RESPIRATORY PANEL BY RT PCR (FLU A&B, COVID)
Influenza A by PCR: NEGATIVE
Influenza B by PCR: NEGATIVE
SARS Coronavirus 2 by RT PCR: NEGATIVE

## 2020-07-07 LAB — SALICYLATE LEVEL: Salicylate Lvl: <7 mg/dL — ABNORMAL LOW (ref 7.0–30.0)

## 2020-07-07 LAB — LITHIUM LEVEL: Lithium Lvl: 0.23 mmol/L — ABNORMAL LOW (ref 0.60–1.20)

## 2020-07-07 LAB — MAGNESIUM: Magnesium: 2.1 mg/dL (ref 1.7–2.4)

## 2020-07-07 LAB — ETHANOL: Alcohol, Ethyl (B): <10 mg/dL (ref <10)

## 2020-07-07 LAB — PROCALCITONIN: Procalcitonin: <0.1 ng/mL

## 2020-07-07 MED ORDER — DULOXETINE HCL 30 MG PO CPEP
30.0000 mg | ORAL_CAPSULE | Freq: Every day | ORAL | Status: DC
  Administered 2020-07-08 – 2020-07-09 (×2): 30 mg via ORAL
  Filled 2020-07-07 (×4): qty 1

## 2020-07-07 MED ORDER — DIVALPROEX SODIUM ER 250 MG PO TB24
1000.0000 mg | ORAL_TABLET | Freq: Every day | ORAL | Status: DC
  Administered 2020-07-07: 1000 mg via ORAL
  Filled 2020-07-07: qty 4

## 2020-07-07 MED ORDER — OLANZAPINE 10 MG PO TABS
20.0000 mg | ORAL_TABLET | Freq: Every day | ORAL | Status: DC
  Administered 2020-07-07 – 2020-07-08 (×2): 20 mg via ORAL
  Filled 2020-07-07 (×2): qty 2

## 2020-07-07 MED ORDER — ACETAMINOPHEN 500 MG PO TABS
1000.0000 mg | ORAL_TABLET | Freq: Once | ORAL | Status: AC
  Administered 2020-07-07: 1000 mg via ORAL
  Filled 2020-07-07: qty 2

## 2020-07-07 MED ORDER — LACTATED RINGERS IV BOLUS
30.0000 mL/kg | Freq: Once | INTRAVENOUS | Status: AC
  Administered 2020-07-07: 2721 mL via INTRAVENOUS

## 2020-07-07 MED ORDER — LACTATED RINGERS IV BOLUS: 1000.0000 mL | Freq: Once | INTRAVENOUS | Status: DC
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
## 2020-07-08 DIAGNOSIS — F69 Unspecified disorder of adult personality and behavior: Secondary | ICD-10-CM | POA: Diagnosis not present

## 2020-07-08 DIAGNOSIS — F6381 Intermittent explosive disorder: Secondary | ICD-10-CM

## 2020-07-08 LAB — BLOOD CULTURE ID PANEL (REFLEXED) - BCID2

## 2020-07-08 LAB — SEDIMENTATION RATE: Sed Rate: 4 mm/hr (ref 0–15)

## 2020-07-08 LAB — PROCALCITONIN: Procalcitonin: <0.1 ng/mL

## 2020-07-08 LAB — C-REACTIVE PROTEIN: CRP: 1 mg/dL — ABNORMAL HIGH (ref <1.0)

## 2020-07-08 MED ORDER — TRIHEXYPHENIDYL HCL 2 MG PO TABS
2.0000 mg | ORAL_TABLET | Freq: Two times a day (BID) | ORAL | Status: DC
  Administered 2020-07-08 – 2020-07-09 (×3): 2 mg via ORAL
  Filled 2020-07-08 (×4): qty 1

## 2020-07-08 MED ORDER — DIVALPROEX SODIUM 500 MG PO DR TAB
750.0000 mg | DELAYED_RELEASE_TABLET | Freq: Two times a day (BID) | ORAL | Status: DC
  Administered 2020-07-08 – 2020-07-09 (×3): 750 mg via ORAL
  Filled 2020-07-08 (×3): qty 1

## 2020-07-08 MED ORDER — ACETAMINOPHEN 500 MG PO TABS
1000.0000 mg | ORAL_TABLET | Freq: Once | ORAL | Status: AC
  Administered 2020-07-08: 1000 mg via ORAL

## 2020-07-08 MED ORDER — CLONIDINE HCL 0.1 MG PO TABS
0.1000 mg | ORAL_TABLET | Freq: Two times a day (BID) | ORAL | Status: DC
  Administered 2020-07-08 – 2020-07-09 (×2): 0.1 mg via ORAL
  Filled 2020-07-08 (×2): qty 1

## 2020-07-08 MED ORDER — LITHIUM CARBONATE 300 MG PO CAPS
450.0000 mg | ORAL_CAPSULE | Freq: Two times a day (BID) | ORAL | Status: DC
  Administered 2020-07-08 – 2020-07-09 (×3): 450 mg via ORAL
  Filled 2020-07-08 (×5): qty 1

## 2020-07-08 MED ORDER — THIOTHIXENE 2 MG PO CAPS
2.0000 mg | ORAL_CAPSULE | Freq: Two times a day (BID) | ORAL | Status: DC
  Filled 2020-07-08 (×2): qty 1

## 2020-07-08 MED ORDER — LITHIUM CARBONATE 150 MG PO CAPS
150.0000 mg | ORAL_CAPSULE | Freq: Two times a day (BID) | ORAL | Status: DC
  Filled 2020-07-08: qty 1

## 2020-07-08 MED ORDER — ACETAMINOPHEN 500 MG PO TABS
ORAL_TABLET | ORAL | Status: AC
  Filled 2020-07-08: qty 2

## 2020-07-08 MED ORDER — CLONAZEPAM 0.5 MG PO TABS
0.5000 mg | ORAL_TABLET | Freq: Two times a day (BID) | ORAL | Status: DC
  Administered 2020-07-08 – 2020-07-09 (×3): 0.5 mg via ORAL
  Filled 2020-07-08 (×3): qty 1

## 2020-07-08 MED ORDER — OLANZAPINE 5 MG PO TABS
5.0000 mg | ORAL_TABLET | Freq: Two times a day (BID) | ORAL | Status: DC | PRN
  Administered 2020-07-08: 5 mg via ORAL
  Filled 2020-07-08: qty 1
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
## 2020-07-08 NOTE — ED Notes (Signed)
One set of blood cultures, SST, lavender top, and light green top collected and sent to lab by this Clinical research associate.
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
## 2020-07-08 NOTE — ED Notes (Signed)
Patient given breakfast tray.

## 2020-07-08 NOTE — ED Notes (Signed)
Pt had a snack of a sandwich tray and sprite

## 2020-07-08 NOTE — ED Notes (Signed)
Patient given diet coke. 
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
## 2020-07-08 NOTE — ED Provider Notes (Signed)
Patient had 1 of 4 blood cultures come back with some kind of staph.  Patient had no complaints he did have a low-grade fever no white count.  No heart murmur I have ordered another set of blood cultures on him I will also order a sed rate CRP and procalcitonin.  At this time I will not pursue the blood cultures further.  We will continue to monitor see what kind of staff it is see if any of the other blood cultures come back positive.   Arnaldo Natal, MD 07/08/20 [phone]

## 2020-07-08 NOTE — ED Provider Notes (Signed)
Emergency Medicine Observation Re-evaluation Note  Louis Edwards is a 34 y.o. male, seen on rounds today.  Pt initially presented to the ED for complaints of Psychiatric Evaluation Currently, the patient is resting, voices no medical complaints.  Physical Exam  BP (!) 138/98   Pulse 86   Temp 100.2 F (37.9 C) (Oral)   Resp (!) 35   Ht 5\' 5"  (1.651 m)   Wt 90.7 kg   SpO2 95%   BMI 33.27 kg/m  Physical Exam General: Resting in no acute distress Cardiac: No cyanosis Lungs: Equal rise and fall Psych: Not agitated  ED Course / MDM  EKG:    I have reviewed the labs performed to date as well as medications administered while in observation.  Recent changes in the last 24 hours include no events overnight.  Plan  Current plan is for psychiatric disposition. Patient is under full IVC at this time.   , MD 07/08/20 [phone]

## 2020-07-08 NOTE — ED Notes (Signed)
Pt informed he will likely return to his group home tomorrow.  Pt upset. Requested and given PRN medication.

## 2020-07-08 NOTE — ED Notes (Signed)
Pt given dinner  

## 2020-07-08 NOTE — Progress Notes (Signed)
PHARMACY - PHYSICIAN COMMUNICATION CRITICAL VALUE ALERT - BLOOD CULTURE IDENTIFICATION (BCID)  Louis Edwards is an 34 y.o. male who presented to [facility] on 07/07/2020 with a chief complaint of psychiatric evaluation.  Assessment:  1/4 bottles growing GPC, identified as staph species (no resistance mechanisms detected). Patient has minimal white count, low grade fever  Name of physician Contacted: Dorothea Glassman, MD  Current antibiotics: none  Changes to prescribed antibiotics recommended:  Recommendations accepted by provider. No antibiotics at this time. Repeat blood cultures, infectious workup ordered.  Results for orders placed or performed during the hospital encounter of 07/07/20  Blood Culture ID Panel (Reflexed) (Collected: 07/07/2020  8:17 PM)  Result Value Ref Range   Enterococcus faecalis NOT DETECTED NOT DETECTED   Enterococcus Faecium NOT DETECTED NOT DETECTED   Listeria monocytogenes NOT DETECTED NOT DETECTED   Staphylococcus species DETECTED (A) NOT DETECTED   Staphylococcus aureus (BCID) NOT DETECTED NOT DETECTED   Staphylococcus epidermidis NOT DETECTED NOT DETECTED   Staphylococcus lugdunensis NOT DETECTED NOT DETECTED   Streptococcus species NOT DETECTED NOT DETECTED   Streptococcus agalactiae NOT DETECTED NOT DETECTED   Streptococcus pneumoniae NOT DETECTED NOT DETECTED   Streptococcus pyogenes NOT DETECTED NOT DETECTED   A.calcoaceticus-baumannii NOT DETECTED NOT DETECTED   Bacteroides fragilis NOT DETECTED NOT DETECTED   Enterobacterales NOT DETECTED NOT DETECTED   Enterobacter cloacae complex NOT DETECTED NOT DETECTED   Escherichia coli NOT DETECTED NOT DETECTED   Klebsiella aerogenes NOT DETECTED NOT DETECTED   Klebsiella oxytoca NOT DETECTED NOT DETECTED   Klebsiella pneumoniae NOT DETECTED NOT DETECTED   Proteus species NOT DETECTED NOT DETECTED   Salmonella species NOT DETECTED NOT DETECTED   Serratia marcescens NOT DETECTED NOT DETECTED    Haemophilus influenzae NOT DETECTED NOT DETECTED   Neisseria meningitidis NOT DETECTED NOT DETECTED   Pseudomonas aeruginosa NOT DETECTED NOT DETECTED   Stenotrophomonas maltophilia NOT DETECTED NOT DETECTED   Candida albicans NOT DETECTED NOT DETECTED   Candida auris NOT DETECTED NOT DETECTED   Candida glabrata NOT DETECTED NOT DETECTED   Candida krusei NOT DETECTED NOT DETECTED   Candida parapsilosis NOT DETECTED NOT DETECTED   Candida tropicalis NOT DETECTED NOT DETECTED   Cryptococcus neoformans/gattii NOT DETECTED NOT DETECTED     Harlow Mares, PharmD Clinical Pharmacist  07/08/2020   6:29 PM

## 2020-07-09 DIAGNOSIS — F69 Unspecified disorder of adult personality and behavior: Secondary | ICD-10-CM | POA: Diagnosis not present

## 2020-07-09 LAB — URINE CULTURE: Culture: <10000 — AB

## 2020-07-09 MED ORDER — DULOXETINE HCL 20 MG PO CPEP: 40.0000 mg | ORAL_CAPSULE | Freq: Every day | ORAL | Status: DC

## 2020-07-09 MED ORDER — THIOTHIXENE 1 MG PO CAPS: 2.0000 mg | ORAL_CAPSULE | Freq: Two times a day (BID) | ORAL | 0 refills | Status: DC

## 2020-07-09 MED ORDER — THIOTHIXENE 2 MG PO CAPS
4.0000 mg | ORAL_CAPSULE | Freq: Two times a day (BID) | ORAL | Status: DC
  Administered 2020-07-09: 4 mg via ORAL
  Filled 2020-07-09 (×2): qty 2

## 2020-07-09 MED ORDER — CLONAZEPAM 0.5 MG PO TABS
0.5000 mg | ORAL_TABLET | Freq: Three times a day (TID) | ORAL | Status: DC
  Administered 2020-07-09: 0.5 mg via ORAL
  Filled 2020-07-09: qty 1

## 2020-07-09 MED ORDER — CLONAZEPAM 0.5 MG PO TABS: 0.5000 mg | ORAL_TABLET | Freq: Three times a day (TID) | ORAL | 0 refills | Status: DC

## 2020-07-09 NOTE — ED Notes (Signed)
Patient and caregivers voice understanding of discharge instructions, and new prescriptions given to caregiver, Patient is without signs of distress, all belongings given back to Patient.

## 2020-07-09 NOTE — ED Notes (Signed)
Patient ate 100% of breakfast, He is calm and cooperative, no behavioral issues at this time, no si/hi or avh.

## 2020-07-09 NOTE — ED Notes (Signed)
Patient is talking on the phone to His probation officer, and He ask to talk to nurse, Nurse did let Probation officer know that the treatment plan had not been determined and that patient could call him back with any new updates, Patient is cooperative, although He ask same questions over and over, will continue to monitor.

## 2020-07-09 NOTE — ED Notes (Signed)
Gave food tray with juice. 

## 2020-07-09 NOTE — ED Notes (Signed)
Patient is aware that He will be discharged and He is singing and jumping up and down, Patient Is getting dressed and His legal guardian was notified of His discharge and states that He will be here to pick Him up soon.

## 2020-07-09 NOTE — ED Provider Notes (Signed)
Emergency Medicine Observation Re-evaluation Note  Louis Edwards is a 34 y.o. male, seen on rounds today.  Pt initially presented to the ED for complaints of Psychiatric Evaluation Currently, the patient is resting.  Physical Exam  BP (!) 145/94   Pulse 85   Temp 98.2 F (36.8 C)   Resp 18   Ht 1.651 m (5\' 5" )   Wt 90.7 kg   SpO2 98%   BMI 33.27 kg/m  Physical Exam Gen:  No acute distress Resp:  Breathing easily and comfortably, no accessory muscle usage Neuro:  Moving all four extremities, no gross focal neuro deficits Psych:  Resting currently, calm and cooperative when awake  ED Course / MDM  EKG:    I have reviewed the labs performed to date as well as medications administered while in observation.  Recent changes in the last 24 hours include one out of four blood cultures coming back positive.  Dr. did some additional evaluation which was all reassuring and a new set of blood cultures are pending, but the plan right now is that this is likely contamination and not indicative of bacteremia and does not require treatment.  Plan  Current plan is for psychiatric disposition, likely back to group home.. Patient is under full IVC at this time.   Louis Catalan, MD 07/09/20 0500

## 2020-07-09 NOTE — ED Notes (Signed)
VOL, papers rescinded, pend dispo 

## 2020-07-09 NOTE — ED Notes (Signed)
VOL, pend dispo 

## 2020-07-09 NOTE — Discharge Summary (Signed)
Physician Discharge Summary Note  Patient:  Louis Edwards is an 34 y.o., male MRN:  [id number] DOB:  Jan 08, 1986 Patient phone:  [phone] (home)  Patient address:   [address] 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 Hyperactivity disorder   . Moderate intellectual disability   . Oppositional defiant disorder    History reviewed. No pertinent surgical history. Family History: History reviewed. No pertinent family history. Family Psychiatric  History:  Already discussed  Social History: lives at group home  Mom is guardian  Social History   Substance and Sexual Activity  Alcohol Use Never     Social History   Substance and Sexual Activity  Drug Use Never    Social History   Socioeconomic History  . Marital status: Single    Spouse name: Not on file  . Number of children: Not on file  . Years of education: Not on file  . Highest education level: Not on file  Occupational History  . Not on file  Tobacco Use  . Smoking status: Never Smoker  . Smokeless tobacco: Never Used  Substance and Sexual Activity  . Alcohol use: Never  . Drug use: Never  . Sexual activity: Not on file  Other Topics Concern  . Not on file  Social History Narrative  . Not on file   Social Determinants of Health   Financial Resource Strain:   . Difficulty of Paying Living Expenses: Not on file  Food Insecurity:   . Worried About Programme researcher, broadcasting/film/video in the Last Year: Not on file  . Ran Out of Food in the Last Year: Not on file   Transportation Needs:   . Lack of Transportation (Medical): Not on file  . Lack of Transportation (Non-Medical): Not on file  Physical Activity:   . Days of Exercise per Week: Not on file  . Minutes of Exercise per Session: Not on file  Stress:   . Feeling of Stress : Not on file  Social Connections:   . Frequency of Communication with Friends and Family: Not on file  . Frequency of Social Gatherings with Friends and Family: Not on file  . Attends Religious Services: Not on file  . Active Member of Clubs or Organizations: Not on file  . Attends Banker Meetings: Not on file  . Marital Status: Not on file    Hospital Course:   In ER briefly ---spoke to group home with TTS.  meds adjusted by adding extra klonopin dose and adding low dose Navane to his zyprexa for better IED and behavior control   He would not benefit from inpatient at this time   Jodi Geralds    Group home and his Mom agree to send him back today   No other problems or side effects ormedical   Physical Findings: AIMS:  , ,  ,  ,   not done  CIWA:    COWS:     Musculoskeletal: Strength & Muscle Tone: within normal limits Gait & Station: normal Patient  leans: N/A  Psychiatric Specialty Exam: Physical Exam  Review of Systems  Blood pressure (!) 145/94, pulse 85, temperature 98.2 F (36.8 C), resp. rate 18, height 5\' 5"  (1.651 m), weight 90.7 kg, SpO2 98 %.Body mass index is 33.27 kg/m.    Mental Status limited Oriented times three Consciousness not clouded or fluctuant Concentration and attention poor OCD questions and repeats Thought process and content see above No further frank psychosis or mania Judgment insight reliability poor Intelligence and fund of knowledge ---poor No shakes tics tremors No active SI HI or plans No violent issues in ER  Memory limited Abstraction poor Speech  --articulation issues present chronic  Rapport and eye contact fair                                                      Sleep okay Handedness not done Cognition limited Assets --caring home and mom Akathisia none Language no change Recall fair to poor  ADL's at times redirection needed    Has this patient used any form of tobacco in the last 30 days? (Cigarettes, Smokeless Tobacco, Cigars, and/or Pipes) Yes, none   Blood Alcohol level:  Lab Results  Component Value Date   ETH <10 07/07/2020   ETH <10 07/02/2020    Metabolic Disorder Labs:  No results found for: HGBA1C, MPG No results found for: PROLACTIN No results found for: CHOL, TRIG, HDL, CHOLHDL, VLDL, LDLCALC  See Psychiatric Specialty Exam and Suicide Risk Assessment completed by Attending Physician prior to discharge.  Discharge destination:  Back to group home   Is patient on multiple antipsychotic therapies at discharge:  Two    Has Patient had three or more failed trials of antipsychotic monotherapy by history:  Yes   Recommended Plan for Multiple Antipsychotic Therapies: Continue with two  Then should go to outpatient for BP meds and also for Psych   Mom is agreeable      Follow-up recommendations:  Outpatient structured activities outings church services  And all.   Comments:  None   Signed: 07/04/2020, MD 07/09/2020, 1:11 PM

## 2020-07-09 NOTE — ED Notes (Signed)
Pt given breakfast tray

## 2020-07-10 LAB — CULTURE, BLOOD (ROUTINE X 2): Special Requests: ADEQUATE

## 2020-07-11 NOTE — Progress Notes (Signed)
ED Antimicrobial Stewardship Positive Culture Follow Up   Louis Edwards is an 34 y.o. male who presented to [facility] on 07/07/2020 with a chief complaint of  Chief Complaint  Patient presents with  . Psychiatric Evaluation    Recent Results (from the past 720 hour(s))  Respiratory Panel by RT PCR (Flu A&B, Covid) - Nasopharyngeal Swab     Status: None   Collection Time: 07/02/20  7:30 PM   Specimen: Nasopharyngeal Swab  Result Value Ref Range Status   SARS Coronavirus 2 by RT PCR NEGATIVE NEGATIVE Final    Comment: (NOTE) SARS-CoV-2 target nucleic acids are NOT DETECTED.  The SARS-CoV-2 RNA is generally detectable in upper respiratoy specimens during the acute phase of infection. The lowest concentration of SARS-CoV-2 viral copies this assay can detect is 131 copies/mL. A negative result does not preclude SARS-Cov-2 infection and should not be used as the sole basis for treatment or other patient management decisions. A negative result may occur with  improper specimen collection/handling, submission of specimen other than nasopharyngeal swab, presence of viral mutation(s) within the areas targeted by this assay, and inadequate number of viral copies (<131 copies/mL). A negative result must be combined with clinical observations, patient history, and epidemiological information. The expected result is Negative.  Fact Sheet for Patients:  https://www.moore.com/  Fact Sheet for Healthcare Providers:  https://www.young.biz/  This test is no t yet approved or cleared by the [place] FDA and  has been authorized for detection and/or diagnosis of SARS-CoV-2 by FDA under an Emergency Use Authorization (EUA). This EUA will remain  in effect (meaning this test can be used) for the duration of the COVID-19 declaration under Section 564(b)(1) of the Act, 21 U.S.C. section 360bbb-3(b)(1), unless the authorization is terminated or revoked  sooner.     Influenza A by PCR NEGATIVE NEGATIVE Final   Influenza B by PCR NEGATIVE NEGATIVE Final    Comment: (NOTE) The Xpert Xpress SARS-CoV-2/FLU/RSV assay is intended as an aid in  the diagnosis of influenza from Nasopharyngeal swab specimens and  should not be used as a sole basis for treatment. Nasal washings and  aspirates are unacceptable for Xpert Xpress SARS-CoV-2/FLU/RSV  testing.  Fact Sheet for Patients: https://www.moore.com/  Fact Sheet for Healthcare Providers: https://www.young.biz/  This test is not yet approved or cleared by the [place] FDA and  has been authorized for detection and/or diagnosis of SARS-CoV-2 by  FDA under an Emergency Use Authorization (EUA). This EUA will remain  in effect (meaning this test can be used) for the duration of the  Covid-19 declaration under Section 564(b)(1) of the Act, 21  U.S.C. section 360bbb-3(b)(1), unless the authorization is  terminated or revoked. Performed at [facility], [address], [place], [place] [address]   Urine culture     Status: Abnormal   Collection Time: 07/07/20  6:09 PM   Specimen: Urine, Random  Result Value Ref Range Status   Specimen Description   Final    URINE, RANDOM Performed at [facility], [address], [place], [place] [address]    Special Requests   Final    NONE Performed at [facility], [address] [place], [place] [address]    Culture (A)  Final    <10,000 COLONIES/mL INSIGNIFICANT GROWTH Performed at [facility], [address], [place], [place] [address]    Report Status 07/09/2020 FINAL  Final  Respiratory Panel by RT PCR (Flu A&B, Covid) -  Nasopharyngeal Swab     Status: None   Collection Time: 07/07/20  8:17 PM   Specimen: Nasopharyngeal Swab  Result Value Ref Range Status   SARS Coronavirus 2 by RT PCR NEGATIVE NEGATIVE Final    Comment: (NOTE) SARS-CoV-2 target nucleic acids  are NOT DETECTED.  The SARS-CoV-2 RNA is generally detectable in upper respiratoy specimens during the acute phase of infection. The lowest concentration of SARS-CoV-2 viral copies this assay can detect is 131 copies/mL. A negative result does not preclude SARS-Cov-2 infection and should not be used as the sole basis for treatment or other patient management decisions. A negative result may occur with  improper specimen collection/handling, submission of specimen other than nasopharyngeal swab, presence of viral mutation(s) within the areas targeted by this assay, and inadequate number of viral copies (<131 copies/mL). A negative result must be combined with clinical observations, patient history, and epidemiological information. The expected result is Negative.  Fact Sheet for Patients:  https://www.moore.com/  Fact Sheet for Healthcare Providers:  https://www.young.biz/  This test is no t yet approved or cleared by the [place] FDA and  has been authorized for detection and/or diagnosis of SARS-CoV-2 by FDA under an Emergency Use Authorization (EUA). This EUA will remain  in effect (meaning this test can be used) for the duration of the COVID-19 declaration under Section 564(b)(1) of the Act, 21 U.S.C. section 360bbb-3(b)(1), unless the authorization is terminated or revoked sooner.     Influenza A by PCR NEGATIVE NEGATIVE Final   Influenza B by PCR NEGATIVE NEGATIVE Final    Comment: (NOTE) The Xpert Xpress SARS-CoV-2/FLU/RSV assay is intended as an aid in  the diagnosis of influenza from Nasopharyngeal swab specimens and  should not be used as a sole basis for treatment. Nasal washings and  aspirates are unacceptable for Xpert Xpress SARS-CoV-2/FLU/RSV  testing.  Fact Sheet for Patients: https://www.moore.com/  Fact Sheet for Healthcare Providers: https://www.young.biz/  This test is not  yet approved or cleared by the [place] FDA and  has been authorized for detection and/or diagnosis of SARS-CoV-2 by  FDA under an Emergency Use Authorization (EUA). This EUA will remain  in effect (meaning this test can be used) for the duration of the  Covid-19 declaration under Section 564(b)(1) of the Act, 21  U.S.C. section 360bbb-3(b)(1), unless the authorization is  terminated or revoked. Performed at [facility], [address] [place], [place] [address]   Blood culture (routine x 2)     Status: Abnormal   Collection Time: 07/07/20  8:17 PM   Specimen: BLOOD  Result Value Ref Range Status   Specimen Description   Final    BLOOD RIGHT ANTECUBITAL Performed at [facility], [address], [place], [place] [address]    Special Requests   Final    BOTTLES DRAWN AEROBIC AND ANAEROBIC Blood Culture adequate volume Performed at [facility], [address] [place], [place] [address]    Culture  Setup Time   Final    ANAEROBIC BOTTLE ONLY GRAM POSITIVE COCCI CRITICAL RESULT CALLED TO, READ BACK BY AND VERIFIED WITH: Eliya Bubar 07/08/20 AT 1710 BY ACR    Culture (A)  Final    STAPHYLOCOCCUS CAPITIS THE SIGNIFICANCE OF ISOLATING THIS ORGANISM FROM A SINGLE SET OF BLOOD CULTURES WHEN MULTIPLE SETS ARE DRAWN IS UNCERTAIN. PLEASE NOTIFY THE MICROBIOLOGY DEPARTMENT WITHIN ONE WEEK IF SPECIATION AND SENSITIVITIES ARE REQUIRED. Performed at [facility], [address] [place], [place]  [id number]    Report Status 07/10/2020 FINAL  Final  Blood culture (routine x 2)     Status: None (Preliminary result)   Collection Time: 07/07/20  8:17 PM   Specimen: BLOOD  Result Value Ref Range Status   Specimen Description BLOOD LEFT ANTECUBITAL  Final   Special Requests   Final    BOTTLES DRAWN AEROBIC AND ANAEROBIC Blood Culture adequate volume   Culture   Final    NO GROWTH 4 DAYS Performed at [facility], [address]  [place], [place] [address]    Report Status PENDING  Incomplete  Blood Culture ID Panel (Reflexed)     Status: Abnormal   Collection Time: 07/07/20  8:17 PM  Result Value Ref Range Status   Enterococcus faecalis NOT DETECTED NOT DETECTED Final   Enterococcus Faecium NOT DETECTED NOT DETECTED Final   Listeria monocytogenes NOT DETECTED NOT DETECTED Final   Staphylococcus species DETECTED (A) NOT DETECTED Final    Comment: CRITICAL RESULT CALLED TO, READ BACK BY AND VERIFIED WITH: Avie Checo 07/08/20 AT 1710 BY ACR    Staphylococcus aureus (BCID) NOT DETECTED NOT DETECTED Final   Staphylococcus epidermidis NOT DETECTED NOT DETECTED Final   Staphylococcus lugdunensis NOT DETECTED NOT DETECTED Final   Streptococcus species NOT DETECTED NOT DETECTED Final   Streptococcus agalactiae NOT DETECTED NOT DETECTED Final   Streptococcus pneumoniae NOT DETECTED NOT DETECTED Final   Streptococcus pyogenes NOT DETECTED NOT DETECTED Final   A.calcoaceticus-baumannii NOT DETECTED NOT DETECTED Final   Bacteroides fragilis NOT DETECTED NOT DETECTED Final   Enterobacterales NOT DETECTED NOT DETECTED Final   Enterobacter cloacae complex NOT DETECTED NOT DETECTED Final   Escherichia coli NOT DETECTED NOT DETECTED Final   Klebsiella aerogenes NOT DETECTED NOT DETECTED Final   Klebsiella oxytoca NOT DETECTED NOT DETECTED Final   Klebsiella pneumoniae NOT DETECTED NOT DETECTED Final   Proteus species NOT DETECTED NOT DETECTED Final   Salmonella species NOT DETECTED NOT DETECTED Final   Serratia marcescens NOT DETECTED NOT DETECTED Final   Haemophilus influenzae NOT DETECTED NOT DETECTED Final   Neisseria meningitidis NOT DETECTED NOT DETECTED Final   Pseudomonas aeruginosa NOT DETECTED NOT DETECTED Final   Stenotrophomonas maltophilia NOT DETECTED NOT DETECTED Final   Candida albicans NOT DETECTED NOT DETECTED Final   Candida auris NOT DETECTED NOT DETECTED Final   Candida glabrata NOT DETECTED NOT DETECTED  Final   Candida krusei NOT DETECTED NOT DETECTED Final   Candida parapsilosis NOT DETECTED NOT DETECTED Final   Candida tropicalis NOT DETECTED NOT DETECTED Final   Cryptococcus neoformans/gattii NOT DETECTED NOT DETECTED Final    Comment: Performed at [facility], [address] [place], [place] [address]  Culture, blood (routine x 2)     Status: None (Preliminary result)   Collection Time: 07/08/20  5:47 PM   Specimen: BLOOD  Result Value Ref Range Status   Specimen Description [facility]  Final   Special Requests   Final    BOTTLES DRAWN AEROBIC AND ANAEROBIC Blood Culture adequate volume   Culture   Final    NO GROWTH 3 DAYS Performed at [name], [address] [place], [place] [address]    Report Status PENDING  Incomplete  Culture, blood (Routine X 2) w Reflex to ID Panel     Status: None (Preliminary result)   Collection Time: 07/08/20 10:42 PM   Specimen: BLOOD  Result Value Ref Range Status   Specimen Description BLOOD  LEFT AC  Final   Special Requests   Final    BOTTLES DRAWN AEROBIC AND ANAEROBIC Blood Culture adequate volume   Culture   Final    NO GROWTH 3 DAYS Performed at [facility], [address], [place], [place] [address]    Report Status PENDING  Incomplete     [x]  Patient discharged originally without antimicrobial agent and no treatment is indicated (only 1/4 bottles growing staph and repeat blood cultures have no growth x 3 days- likely contaminant)   07/11/2020, 2:12 PM Clinical Pharmacist

## 2020-07-12 LAB — CULTURE, BLOOD (ROUTINE X 2)
Culture: NO GROWTH
Special Requests: ADEQUATE

## 2020-07-13 LAB — CULTURE, BLOOD (ROUTINE X 2)
Culture: NO GROWTH
Culture: NO GROWTH
Special Requests: ADEQUATE
Special Requests: ADEQUATE

## 2021-01-17 ENCOUNTER — Other Ambulatory Visit: Payer: Self-pay

## 2021-01-17 ENCOUNTER — Encounter: Payer: Self-pay | Admitting: Emergency Medicine

## 2021-01-17 ENCOUNTER — Emergency Department
Admission: EM | Admit: 2021-01-17 | Discharge: 2021-01-17 | Disposition: A | Discharge status: Home / Self Care | Payer: Medicare Other | Attending: Emergency Medicine | Admitting: Emergency Medicine

## 2021-01-17 DIAGNOSIS — R456 Violent behavior: Secondary | ICD-10-CM | POA: Diagnosis present

## 2021-01-17 DIAGNOSIS — F71 Moderate intellectual disabilities: Secondary | ICD-10-CM | POA: Diagnosis not present

## 2021-01-17 DIAGNOSIS — Z79899 Other long term (current) drug therapy: Secondary | ICD-10-CM | POA: Diagnosis not present

## 2021-01-17 DIAGNOSIS — R4689 Other symptoms and signs involving appearance and behavior: Secondary | ICD-10-CM | POA: Insufficient documentation

## 2021-01-17 DIAGNOSIS — R45851 Suicidal ideations: Secondary | ICD-10-CM

## 2021-01-17 DIAGNOSIS — F99 Mental disorder, not otherwise specified: Secondary | ICD-10-CM

## 2021-01-17 LAB — URINE DRUG SCREEN, QUALITATIVE (ARMC ONLY)
Amphetamines, Ur Screen: NOT DETECTED
Barbiturates, Ur Screen: NOT DETECTED
Benzodiazepine, Ur Scrn: NOT DETECTED
Cannabinoid 50 Ng, Ur ~~LOC~~: NOT DETECTED
Cocaine Metabolite,Ur ~~LOC~~: NOT DETECTED
MDMA (Ecstasy)Ur Screen: NOT DETECTED
Methadone Scn, Ur: NOT DETECTED
Opiate, Ur Screen: NOT DETECTED
Phencyclidine (PCP) Ur S: NOT DETECTED
Tricyclic, Ur Screen: NOT DETECTED

## 2021-01-17 LAB — COMPREHENSIVE METABOLIC PANEL
ALT: 21 U/L (ref 0–44)
AST: 19 U/L (ref 15–41)
Albumin: 4.5 g/dL (ref 3.5–5.0)
Alkaline Phosphatase: 70 U/L (ref 38–126)
Anion gap: 8 (ref 5–15)
BUN: 12 mg/dL (ref 6–20)
CO2: 22 mmol/L (ref 22–32)
Calcium: 9.9 mg/dL (ref 8.9–10.3)
Chloride: 109 mmol/L (ref 98–111)
Creatinine, Ser: 0.79 mg/dL (ref 0.61–1.24)
GFR, Estimated: >60 mL/min (ref >60)
Glucose, Bld: 73 mg/dL (ref 70–99)
Potassium: 4.2 mmol/L (ref 3.5–5.1)
Sodium: 139 mmol/L (ref 135–145)
Total Bilirubin: 0.8 mg/dL (ref 0.3–1.2)
Total Protein: 7.5 g/dL (ref 6.5–8.1)

## 2021-01-17 LAB — CBC
HCT: 41.6 % (ref 39.0–52.0)
Hemoglobin: 14.5 g/dL (ref 13.0–17.0)
MCH: 32.6 pg (ref 26.0–34.0)
MCHC: 34.9 g/dL (ref 30.0–36.0)
MCV: 93.5 fL (ref 80.0–100.0)
Platelets: 152 10*3/uL (ref 150–400)
RBC: 4.45 MIL/uL (ref 4.22–5.81)
RDW: 12.5 % (ref 11.5–15.5)
WBC: 6.6 10*3/uL (ref 4.0–10.5)
nRBC: 0 % (ref 0.0–0.2)

## 2021-01-17 LAB — ETHANOL: Alcohol, Ethyl (B): <10 mg/dL (ref <10)

## 2021-01-17 LAB — ACETAMINOPHEN LEVEL: Acetaminophen (Tylenol), Serum: <10 ug/mL — ABNORMAL LOW (ref 10–30)

## 2021-01-17 LAB — SALICYLATE LEVEL: Salicylate Lvl: <7 mg/dL — ABNORMAL LOW (ref 7.0–30.0)

## 2021-01-17 LAB — LITHIUM LEVEL: Lithium Lvl: 1.02 mmol/L (ref 0.60–1.20)

## 2021-01-17 MED ORDER — OLANZAPINE 10 MG PO TABS
20.0000 mg | ORAL_TABLET | Freq: Every day | ORAL | Status: DC
  Administered 2021-01-17: 20 mg via ORAL
  Filled 2021-01-17 (×2): qty 2

## 2021-01-17 MED ORDER — THIOTHIXENE 5 MG PO CAPS
5.0000 mg | ORAL_CAPSULE | Freq: Two times a day (BID) | ORAL | Status: DC
  Administered 2021-01-17 (×2): 5 mg via ORAL
  Filled 2021-01-17 (×3): qty 1

## 2021-01-17 MED ORDER — OLANZAPINE 5 MG PO TBDP
5.0000 mg | ORAL_TABLET | ORAL | Status: AC
  Administered 2021-01-17: 5 mg via ORAL
  Filled 2021-01-17: qty 1

## 2021-01-17 MED ORDER — LITHIUM CARBONATE ER 450 MG PO TBCR
450.0000 mg | EXTENDED_RELEASE_TABLET | Freq: Two times a day (BID) | ORAL | Status: DC
  Administered 2021-01-17 (×2): 450 mg via ORAL
  Filled 2021-01-17 (×2): qty 1

## 2021-01-17 MED ORDER — TRIHEXYPHENIDYL HCL 2 MG PO TABS
1.0000 mg | ORAL_TABLET | Freq: Two times a day (BID) | ORAL | Status: DC
  Administered 2021-01-17: 1 mg via ORAL
  Filled 2021-01-17 (×3): qty 1

## 2021-01-17 MED ORDER — DIVALPROEX SODIUM 500 MG PO DR TAB
750.0000 mg | DELAYED_RELEASE_TABLET | Freq: Two times a day (BID) | ORAL | Status: DC
  Administered 2021-01-17 (×2): 750 mg via ORAL
  Filled 2021-01-17: qty 1

## 2021-01-17 NOTE — ED Notes (Signed)
Pt eating dinner tray  Pt calm and cooperative. 

## 2021-01-17 NOTE — ED Notes (Signed)
Pt provided with [place] sandwich tray and sprite for snack tonight.

## 2021-01-17 NOTE — ED Notes (Signed)
This nurse contacts pt [facility] and [facility] manager on DC. Ride to arrive in approx 30  Minutes. Provided with instructions on what to do when he arrives.

## 2021-01-17 NOTE — ED Notes (Signed)
Resumed care from jeanette rn.  Pt sleeping in room.

## 2021-01-17 NOTE — ED Notes (Signed)
Pt was in room 8 when care assumed, corrected now to represent this on board.
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
## 2021-01-17 NOTE — ED Notes (Signed)
Attempted to call Legal guardian, no answer.

## 2021-01-17 NOTE — ED Notes (Signed)
Hourly rounding performed, patient currently asleep in room. Patient has no complaints at this time. Q15 minute rounds and monitoring via Security Cameras to continue. 
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
## 2021-01-17 NOTE — ED Notes (Signed)
Consent signed on paper copy by Jodi Geralds during DC.
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
## 2021-01-17 NOTE — ED Notes (Signed)
Pt sleeping in bed

## 2021-01-17 NOTE — ED Notes (Signed)
Personal Belongings:  Press photographer shoes/sandals Black socks Purple shorts White underwear 2 bags of books.

## 2021-01-17 NOTE — ED Notes (Signed)
Vol pending re eval by Dr. Toni Amend 4/15

## 2021-01-17 NOTE — ED Notes (Signed)
meds given

## 2021-01-17 NOTE — ED Notes (Signed)
Pt sleeping. 

## 2021-01-17 NOTE — ED Notes (Signed)
Hourly rounding performed, patient currently awake in room. Patient has no complaints at this time. Q15 minute rounds and monitoring via Security Cameras to continue. 

## 2021-01-17 NOTE — ED Notes (Signed)
Report received from Amy, RN including SBAR. Patient alert and oriented, warm and dry, in no acute distress. Patient denies SI, HI, AVH and pain. Patient made aware of Q15 minute rounds and security cameras for their safety. Patient instructed to come to this nurse with needs or concerns.  

## 2021-01-17 NOTE — ED Notes (Signed)
This nurse received call from first nurse stating that Jodi Geralds, [facility] manager was requesting call and update for pt. This nurse calls Mr. Luiz Blare to share update, Mr. Luiz Blare states he has been in contact with LG of pt and they both prefer to have pt back at [facility]. Mr. Luiz Blare states they have psych care at [facility] and a program to handle pt. States that today pt insisted on coming to ED so [facility] brought him here. Mr. Luiz Blare was informed that per the notes the staff was unable to reach him or mother of pt today for collateral information. Numbers have been updated in chart for both and Annice Pih, NP has been notified and ED MD will be sent secure chat message

## 2021-01-17 NOTE — ED Notes (Signed)
Pt is changing into belongings for DC at this time

## 2021-02-19 ENCOUNTER — Telehealth: Payer: Self-pay | Admitting: Urology

## 2021-02-19 ENCOUNTER — Ambulatory Visit (INDEPENDENT_AMBULATORY_CARE_PROVIDER_SITE_OTHER): Payer: Medicare Other | Admitting: Urology

## 2021-02-19 ENCOUNTER — Encounter: Payer: Self-pay | Admitting: Urology

## 2021-02-19 ENCOUNTER — Other Ambulatory Visit: Payer: Self-pay

## 2021-02-19 VITALS — BP 139/92 | HR 93 | Wt 215.0 lb

## 2021-02-19 DIAGNOSIS — N400 Enlarged prostate without lower urinary tract symptoms: Secondary | ICD-10-CM | POA: Diagnosis not present

## 2021-02-19 DIAGNOSIS — N3941 Urge incontinence: Secondary | ICD-10-CM

## 2021-02-19 LAB — BLADDER SCAN AMB NON-IMAGING

## 2021-02-19 MED ORDER — GEMTESA 75 MG PO TABS: 75.0000 mg | ORAL_TABLET | Freq: Every day | ORAL | 2 refills | Status: DC

## 2021-02-19 NOTE — Progress Notes (Signed)
02/19/2021 4:03 PM   Louis Edwards 13-Apr-1986 [id number]  Referring provider: Anselm Jungling, NP [phone] [place],  [place] [address]  Chief Complaint  Patient presents with  . Benign Prostatic Hypertrophy    New Patient    HPI: 35 year old male who presents today accompanied by caretaker to discuss urinary symptoms.  Both the patient as well as the patient's mother available by telephone provided to additional history.    He reports longstanding issues with urinary urgency and urge incontinence.  He is currently on oxybutynin 10 mg XL which he has been on for at least 5+ years.  He reports that when he was 11 or 35 years old, he was seen by pediatric neurologist.  He had some sort of procedure/"bladder stretch" at which time he was told that his bladder was very small in size.  There was some thoughts that his bladder might grow with him.  He reports that he is a lifelong history of nocturnal enuresis.  He also has daytime urgency but primarily urge incontinence.  This is worse when there act traveling especially on Outings on the bus when he just cannot get to the bathroom on time.  He will wet himself with large volume voids and has a completely change his clothes secondary to saturation.  He does not wear diapers.  This happens at least daily.  He denies any dysuria or gross hematuria.  No issues with infections.  He does live in a group home and has multiple underlying behavioral and psychiatric issues.  Today in the office, he is extremely pleasant.  Medications include lithium, Klonopin, antipsychotics amongst others.  No issues with constipation.   PMH: Past Medical History:  Diagnosis Date  . Hyperactivity disorder   . Moderate intellectual disability   . Oppositional defiant disorder     Surgical History: No past surgical history on file.  Home Medications:  Allergies as of 02/19/2021      Reactions   Keflex [cephalexin]    Risperdal [risperidone]     Thorazine [chlorpromazine]       Medication List       Accurate as of Feb 19, 2021 11:59 PM. If you have any questions, ask your nurse or doctor.        STOP taking these medications   oxybutynin 5 MG 24 hr tablet Commonly known as: DITROPAN-XL Stopped by: Vanna Scotland, MD     TAKE these medications   clonazePAM 1 MG tablet Commonly known as: KLONOPIN Take 1 mg by mouth as needed for anxiety. What changed: Another medication with the same name was removed. Continue taking this medication, and follow the directions you see here. Changed by: Vanna Scotland, MD   Depakote 500 MG DR tablet Generic drug: divalproex Take 500 mg by mouth 2 (two) times daily. What changed: Another medication with the same name was removed. Continue taking this medication, and follow the directions you see here. Changed by: Vanna Scotland, MD   DULoxetine 30 MG capsule Commonly known as: CYMBALTA Take 30 mg by mouth daily.   Gemtesa 75 MG Tabs Generic drug: Vibegron Take 75 mg by mouth daily. Started by: Vanna Scotland, MD   haloperidol 5 MG tablet Commonly known as: HALDOL Take 5 mg by mouth 2 (two) times daily.   lithium carbonate 150 MG capsule Take 150 mg by mouth 2 (two) times daily with a meal.   OLANZapine 20 MG tablet Commonly known as: ZYPREXA Take 20 mg by mouth at bedtime.  thiothixene 10 MG capsule Commonly known as: NAVANE Take 10 mg by mouth 3 (three) times daily. What changed: Another medication with the same name was removed. Continue taking this medication, and follow the directions you see here. Changed by: Vanna Scotland, MD   trihexyphenidyl 2 MG tablet Commonly known as: ARTANE Take 2 mg by mouth 3 (three) times daily with meals. What changed: Another medication with the same name was removed. Continue taking this medication, and follow the directions you see here. Changed by: Vanna Scotland, MD       Allergies:  Allergies  Allergen Reactions  . Keflex  [Cephalexin]   . Risperdal [Risperidone]   . Thorazine [Chlorpromazine]     Family History: No family history on file.  Social History:  reports that he has never smoked. He has never used smokeless tobacco. He reports that he does not drink alcohol and does not use drugs.   Physical Exam: BP (!) 139/92   Pulse 93   Wt 215 lb (97.5 kg)   BMI 35.78 kg/m   Constitutional:  Alert and oriented, No acute distress. HEENT:  AT, moist mucus membranes.  Trachea midline, no masses. Cardiovascular: No clubbing, cyanosis, or edema. Respiratory: Normal respiratory effort, no increased work of breathing. Skin: No rashes, bruises or suspicious lesions. Neurologic: Grossly intact, no focal deficits, moving all 4 extremities. Psychiatric: Normal mood and affect.  Laboratory Data: Lab Results  Component Value Date   WBC 6.6 01/17/2021   HGB 14.5 01/17/2021   HCT 41.6 01/17/2021   MCV 93.5 01/17/2021   PLT 152 01/17/2021    Lab Results  Component Value Date   CREATININE 0.79 01/17/2021   Urinalysis Results for orders placed or performed in visit on 02/19/21  BLADDER SCAN AMB NON-IMAGING  Result Value Ref Range   Scan Result 95ml    UA negative, see EPIC  Assessment & Plan:    1. Urge incontinence Severe urge incontinence, lifelong  Symptoms are poorly controlled on oxybutynin, will try beta 3 agonist as alternative  That this fails, he may benefit from urodynamics although discussion with his mother today, she thinks this cannot be tolerated.  There is no indication of incomplete bladder emptying or urinary tract infection is contributing factor. - Urinalysis, Complete - BLADDER SCAN AMB NON-IMAGING   Return in about 4 weeks (around 03/19/2021) for 1 month w/PVR.  Vanna Scotland, MD  [facility] [address], [address] [place], [place] [address] [phone]

## 2021-02-19 NOTE — Telephone Encounter (Signed)
S/W pt's mother, Jesusita Oka, [phone] giving permission for [place] to see pt.

## 2021-02-19 NOTE — Patient Instructions (Addendum)
Stop Oxybutinin and start Gemtesa 1 tablet daily. Follow up in 1 month

## 2021-02-22 LAB — URINALYSIS, COMPLETE
Bilirubin, UA: NEGATIVE
Glucose, UA: NEGATIVE
Ketones, UA: NEGATIVE
Leukocytes,UA: NEGATIVE
Nitrite, UA: NEGATIVE
Protein,UA: NEGATIVE
RBC, UA: NEGATIVE
Specific Gravity, UA: 1.02 (ref 1.005–1.030)
Urobilinogen, Ur: 0.2 mg/dL (ref 0.2–1.0)
pH, UA: 7 (ref 5.0–7.5)

## 2021-02-22 LAB — MICROSCOPIC EXAMINATION
Bacteria, UA: NONE SEEN
RBC, Urine: NONE SEEN /hpf (ref 0–2)

## 2021-03-20 ENCOUNTER — Ambulatory Visit: Payer: Medicare Other | Admitting: Urology

## 2021-03-22 ENCOUNTER — Encounter: Payer: Self-pay | Admitting: Urology

## 2021-04-12 ENCOUNTER — Telehealth: Payer: Self-pay | Admitting: *Deleted

## 2021-04-12 NOTE — Telephone Encounter (Signed)
Sorry, it looks like I actually prescribed Gemtesa, not Myrbetriq.  Are both of these not approved?  He is on a lot of other medication and I am worried about putting him on an anticholinergic.  Vanna Scotland, MD

## 2021-04-12 NOTE — Telephone Encounter (Signed)
I do not understand this because I prescribed him Myrbetriq.?

## 2021-04-12 NOTE — Telephone Encounter (Signed)
Verified with pharmacy PA for Myrbetriq denied

## 2021-04-12 NOTE — Telephone Encounter (Signed)
Received call from pharmacy PA for Oxybutynin was denied-is there an alternative available?  No showed for follow up on 03/19/21

## 2021-04-15 NOTE — Telephone Encounter (Signed)
Both not approved-any alternatives?

## 2021-04-15 NOTE — Telephone Encounter (Signed)
Maybe we can give him samples of 1 or the other and then see if it actually is effective.  If so, then we can try to appeal.   Vanna Scotland, MD

## 2021-04-27 ENCOUNTER — Other Ambulatory Visit: Payer: Self-pay

## 2021-04-27 ENCOUNTER — Encounter: Payer: Self-pay | Admitting: Emergency Medicine

## 2021-04-27 ENCOUNTER — Emergency Department
Admission: EM | Admit: 2021-04-27 | Discharge: 2021-04-28 | Disposition: A | Discharge status: Home / Self Care | Payer: Medicare Other | Attending: Student in an Organized Health Care Education/Training Program | Admitting: Student in an Organized Health Care Education/Training Program

## 2021-04-27 ENCOUNTER — Emergency Department: Payer: Medicare Other

## 2021-04-27 DIAGNOSIS — X509XXA Other and unspecified overexertion or strenuous movements or postures, initial encounter: Secondary | ICD-10-CM | POA: Diagnosis not present

## 2021-04-27 DIAGNOSIS — M7989 Other specified soft tissue disorders: Secondary | ICD-10-CM | POA: Diagnosis not present

## 2021-04-27 DIAGNOSIS — S82302A Unspecified fracture of lower end of left tibia, initial encounter for closed fracture: Secondary | ICD-10-CM | POA: Diagnosis not present

## 2021-04-27 DIAGNOSIS — S93402A Sprain of unspecified ligament of left ankle, initial encounter: Secondary | ICD-10-CM | POA: Diagnosis not present

## 2021-04-27 DIAGNOSIS — S8992XA Unspecified injury of left lower leg, initial encounter: Secondary | ICD-10-CM | POA: Diagnosis present

## 2021-04-27 MED ORDER — IBUPROFEN 800 MG PO TABS
800.0000 mg | ORAL_TABLET | Freq: Once | ORAL | Status: AC
  Administered 2021-04-27: 800 mg via ORAL
  Filled 2021-04-27: qty 1

## 2021-04-27 NOTE — Discharge Instructions (Addendum)
Wear the boot when walking around.  Use crutches to help with weightbearing.  Take ibuprofen for pain relief.  Rest with the foot elevated and apply ice when not walking.  Follow-up with podiatry in the next week for further fracture care.

## 2021-04-27 NOTE — ED Notes (Addendum)
Called pt's legal guardian, Darl Pikes (mother), to notify of discharge. Was unable to notify, left message. Staff with pt to bring back home.

## 2021-04-27 NOTE — ED Provider Notes (Signed)
[facility] Emergency Department Provider Note ____________________________________________  Time seen: 1708  I have reviewed the triage vital signs and the nursing notes.  HISTORY  Chief Complaint  Ankle Pain  HPI Louis Edwards is a 35 y.o. male presents to the ED for evaluation of a left ankle pain following a mechanical injury. He twisted his ankle about an hour prior to arrival. He denies any other injury.    Past Medical History:  Diagnosis Date   Hyperactivity disorder    Moderate intellectual disability    Oppositional defiant disorder     Patient Active Problem List   Diagnosis Date Noted   Moderate intellectual disability    Intermittent explosive disorder 07/08/2020    History reviewed. No pertinent surgical history.  Prior to Admission medications   Medication Sig Start Date End Date Taking? Authorizing Provider  clonazePAM (KLONOPIN) 1 MG tablet Take 1 mg by mouth as needed for anxiety.    [provider]  divalproex (DEPAKOTE) 500 MG DR tablet Take 500 mg by mouth 2 (two) times daily.    [provider]  DULoxetine (CYMBALTA) 30 MG capsule Take 30 mg by mouth daily.    [provider]  haloperidol (HALDOL) 5 MG tablet Take 5 mg by mouth 2 (two) times daily.    [provider]  lithium carbonate 150 MG capsule Take 150 mg by mouth 2 (two) times daily with a meal.    [provider]  OLANZapine (ZYPREXA) 20 MG tablet Take 20 mg by mouth at bedtime.    [provider]  thiothixene (NAVANE) 10 MG capsule Take 10 mg by mouth 3 (three) times daily.    [provider]  trihexyphenidyl (ARTANE) 2 MG tablet Take 2 mg by mouth 3 (three) times daily with meals.    [provider]  Vibegron (GEMTESA) 75 MG TABS Take 75 mg by mouth daily. 02/19/21   Vanna Scotland, MD    Allergies Keflex [cephalexin], Risperdal [risperidone], and Thorazine [chlorpromazine]  History reviewed.  No pertinent family history.  Social History Social History   Tobacco Use   Smoking status: Never   Smokeless tobacco: Never  Substance Use Topics   Alcohol use: Never   Drug use: Never    Review of Systems  Constitutional: Negative for fever. Eyes: Negative for visual changes. ENT: Negative for sore throat. Cardiovascular: Negative for chest pain. Respiratory: Negative for shortness of breath. Gastrointestinal: Negative for abdominal pain, vomiting and diarrhea. Genitourinary: Negative for dysuria. Musculoskeletal: Negative for back pain. Left ankle pain as above Skin: Negative for rash. Neurological: Negative for headaches, focal weakness or numbness. ____________________________________________  PHYSICAL EXAM:  VITAL SIGNS: ED Triage Vitals  Enc Vitals Group     BP 04/27/21 1449 103/76     Pulse Rate 04/27/21 1449 (!) 103     Resp 04/27/21 1449 20     Temp 04/27/21 1449 98.4 F (36.9 C)     Temp Source 04/27/21 1449 Oral     SpO2 04/27/21 1449 100 %     Weight 04/27/21 1450 235 lb (106.6 kg)     Height 04/27/21 1450 5\' 4"  (1.626 m)     Head Circumference --      Peak Flow --      Pain Score 04/27/21 1449 10     Pain Loc --      Pain Edu? --      Excl. in GC? --     Constitutional: Alert and  oriented. Well appearing and in no distress. Head: Normocephalic and atraumatic. Cardiovascular: Normal rate, regular rhythm. Normal distal pulses. Respiratory: Normal respiratory effort.  Musculoskeletal: Left ankle with obvious soft tissue swelling to the medial lateral aspect of the malleoli.  Is also some early ecchymosis appreciated.  Patient is a Coban wrap in place, which was removed to reveal a superficial abrasion over the dorsal ankle.  Patient is able demonstrate normal ankle flexion extension range and normal toe flexion distally.  Nontender with normal range of motion in all extremities.  Neurologic: Cranial nerves II to XII grossly intact. Normal speech and  language. No gross focal neurologic deficits are appreciated. Skin:  Skin is warm, dry and intact. No rash noted. ____________________________________________   RADIOLOGY  Official radiology report(s): DG Ankle Complete Left  Result Date: 04/27/2021 CLINICAL DATA:  Left ankle injury post twisting injury earlier today. Pain on posterior aspect of ankle which radiates superiorly. EXAM: LEFT ANKLE COMPLETE - 3+ VIEW COMPARISON:  None. FINDINGS: Small irregular bone fragment projects adjacent to the posterior aspect of the distal tibia on the lateral view consistent with a fracture. The fracture origin is unclear. There is no other evidence of a fracture. Ankle joint is normally spaced and aligned. No arthropathic changes. There is diffuse surrounding soft tissue swelling. Moderate-sized plantar and dorsal calcaneal spurs. IMPRESSION: 1. Small fracture along the posterior margin the distal tibia seen on the lateral view. There origin of this fracture fragment unclear. There is no other evidence of a fracture. Diffuse surrounding soft tissue swelling. Electronically Signed   By: Amie Portland M.D.   On: 04/27/2021 15:20   ____________________________________________  PROCEDURES  CAM boot Crutches IBU 800 mg PO  Procedures ____________________________________________   INITIAL IMPRESSION / ASSESSMENT AND PLAN / ED COURSE  As part of my medical decision making, I reviewed the following data within the electronic MEDICAL RECORD NUMBER Radiograph reviewed as above and Notes from prior ED visits   Patient ED evaluation of a mechanical injury, resulting in an pain and disability to the left ankle.  He was evaluated in the ED for his complaints, and found to have radiologic evidence of ankle sprain with a small fracture fragment to the distal posterior tibia. He is placed in a CAM boot and given crutches for gait-assistance.    Louis Edwards was evaluated in Emergency Department on 04/27/2021 for the  symptoms described in the history of present illness. He was evaluated in the context of the global COVID-19 pandemic, which necessitated consideration that the patient might be at risk for infection with the SARS-CoV-2 virus that causes COVID-19. Institutional protocols and algorithms that pertain to the evaluation of patients at risk for COVID-19 are in a state of rapid change based on information released by regulatory bodies including the CDC and federal and state organizations. These policies and algorithms were followed during the patient's care in the ED. ____________________________________________  FINAL CLINICAL IMPRESSION(S) / ED DIAGNOSES  Final diagnoses:  Sprain of left ankle, unspecified ligament, initial encounter  Closed fracture of distal end of left tibia, unspecified fracture morphology, initial encounter      Lissa Hoard, PA-C 04/27/21 1837    Willy Eddy, MD 04/27/21 2309

## 2021-04-27 NOTE — ED Triage Notes (Signed)
Pt via POV from [facility] from Group Home. Pt twisted his ankle 1 hour PTA. Pt c/o L ankle pain. Pt is a&Ox4 and NAD

## 2021-04-30 NOTE — Telephone Encounter (Signed)
Attempted to reach patient-left VM to contact office

## 2021-05-02 ENCOUNTER — Other Ambulatory Visit: Payer: Self-pay

## 2021-05-02 ENCOUNTER — Ambulatory Visit (INDEPENDENT_AMBULATORY_CARE_PROVIDER_SITE_OTHER): Payer: Medicare Other | Admitting: Urology

## 2021-05-02 DIAGNOSIS — N3941 Urge incontinence: Secondary | ICD-10-CM

## 2021-05-02 LAB — BLADDER SCAN AMB NON-IMAGING: Scan Result: 41

## 2021-05-02 NOTE — Progress Notes (Signed)
05/02/2021 10:28 AM   Dutch Quint [facility], [place] 1986/08/13 [id number]  Chief Complaint  Patient presents with   Urinary Incontinence     HPI: 35 year old male with severe OAB and storage related issues who returns today for follow-up.  Today he is slightly more limited historian than on previous occasion.  He is accompanied by an attendant from his group home.  Since last visit, he is fallen and sprained his left ankle.  He is wearing a boot.  Ultimately, he was able to get Gemtesa 75 mg which he has been taking for approximately a month.  There is some overlap between the [facility] and oxybutynin for the first 2 weeks and he since stopped the oxybutynin.  Overall today, both he and his attendant believes that he is having less accidents.  In fact, he can recall no accidents over the past month.  Does have some issues with mobility currently related to his lower extremity injury and so he does have some urgency attributable to this.  No side effects of the medication.  Bladder emptying is sufficient, see PVR below.   PMH: Past Medical History:  Diagnosis Date   Hyperactivity disorder    Moderate intellectual disability    Oppositional defiant disorder     Surgical History: No past surgical history on file.  Home Medications:  Allergies as of 05/02/2021       Reactions   Keflex [cephalexin]    Risperdal [risperidone]    Thorazine [chlorpromazine]         Medication List        Accurate as of May 02, 2021 10:28 AM. If you have any questions, ask your nurse or doctor.          clonazePAM 1 MG tablet Commonly known as: KLONOPIN Take 1 mg by mouth as needed for anxiety.   Depakote 500 MG DR tablet Generic drug: divalproex Take 500 mg by mouth 2 (two) times daily.   DULoxetine 30 MG capsule Commonly known as: CYMBALTA Take 30 mg by mouth daily.   Gemtesa 75 MG Tabs Generic drug: Vibegron Take 75 mg by mouth daily.   haloperidol 5 MG tablet Commonly known  as: HALDOL Take 5 mg by mouth 2 (two) times daily.   lithium carbonate 150 MG capsule Take 150 mg by mouth 2 (two) times daily with a meal.   OLANZapine 20 MG tablet Commonly known as: ZYPREXA Take 20 mg by mouth at bedtime.   thiothixene 10 MG capsule Commonly known as: NAVANE Take 10 mg by mouth 3 (three) times daily.   trihexyphenidyl 2 MG tablet Commonly known as: ARTANE Take 2 mg by mouth 3 (three) times daily with meals.        Allergies:  Allergies  Allergen Reactions   Keflex [Cephalexin]    Risperdal [Risperidone]    Thorazine [Chlorpromazine]     Family History: No family history on file.  Social History:  reports that he has never smoked. He has never used smokeless tobacco. He reports that he does not drink alcohol and does not use drugs.   Physical Exam: Constitutional:  Alert and oriented, No acute distress. HEENT: [place] AT, moist mucus membranes.  Trachea midline, no masses. Cardiovascular: No clubbing, cyanosis, or edema. Respiratory: Normal respiratory effort, no increased work of breathing. MSK: Left with some bruising of his calf/ shin Neurologic: Grossly intact, no focal deficits, moving all 4 extremities. Psychiatric: Normal mood and affect.  Laboratory Data: Lab Results  Component Value Date  WBC 6.6 01/17/2021   HGB 14.5 01/17/2021   HCT 41.6 01/17/2021   MCV 93.5 01/17/2021   PLT 152 01/17/2021    Lab Results  Component Value Date   CREATININE 0.79 01/17/2021    Pertinent Imaging: Results for orders placed or performed in visit on 05/02/21  BLADDER SCAN AMB NON-IMAGING  Result Value Ref Range   Scan Result 41 ml      Assessment & Plan:    1. Urge incontinence Symptoms improved significantly with Gemtesa 75 mg  Given is on multiple psych medications, anticholinergics are contraindicated/discouraged due to drug drug interactions.  As such, prefer to keep him on a beta 3 agonist which in fact appears to be more effective.  We  may have to appeal to his insurance for a tier exception in light of this.  Will follow-up next year, continue meds as above. - BLADDER SCAN AMB NON-IMAGING   Vanna Scotland, MD  [facility] [address] [place], [place] [address] [phone]

## 2021-05-06 ENCOUNTER — Telehealth: Payer: Self-pay | Admitting: Family Medicine

## 2021-05-06 NOTE — Telephone Encounter (Signed)
Larita Fife from [facility] call her back at [phone]. He is in a group home and she is wanting to have an appeal done because the Leslye Peer is not approved but it is working.

## 2021-05-13 NOTE — Telephone Encounter (Signed)
Started Appeal  [id number]-contacted Larita Fife will notify once a decision is made.

## 2021-06-14 NOTE — Telephone Encounter (Signed)
Incoming call from [place] at [facility] who states that she is awaiting to hear back on appeal. She states that they have already given the patient $1500.00 worth of medication and will not be able to dispense anymore. If appeal denied, medication will need to be changed. Larita Fife can be be reached at [phone].

## 2021-06-14 NOTE — Telephone Encounter (Signed)
Ref# [id number] spoke with Kenney Houseman at [facility] fax additional forms to reprocess appeal

## 2021-06-28 ENCOUNTER — Ambulatory Visit: Payer: Medicare Other

## 2021-06-28 NOTE — Telephone Encounter (Signed)
Received Appeal approval Larita Fife, notified. Auth# [id number] Faxed approval to [facility] at [facility]

## 2021-07-01 ENCOUNTER — Ambulatory Visit (INDEPENDENT_AMBULATORY_CARE_PROVIDER_SITE_OTHER): Payer: Medicare Other | Admitting: Internal Medicine

## 2021-07-01 ENCOUNTER — Other Ambulatory Visit: Payer: Self-pay

## 2021-07-01 VITALS — BP 146/88 | HR 104 | Resp 16 | Ht 66.0 in | Wt 226.3 lb

## 2021-07-01 DIAGNOSIS — F29 Unspecified psychosis not due to a substance or known physiological condition: Secondary | ICD-10-CM | POA: Diagnosis not present

## 2021-07-01 DIAGNOSIS — I10 Essential (primary) hypertension: Secondary | ICD-10-CM | POA: Diagnosis not present

## 2021-07-01 DIAGNOSIS — G4733 Obstructive sleep apnea (adult) (pediatric): Secondary | ICD-10-CM | POA: Diagnosis not present

## 2021-07-01 DIAGNOSIS — E669 Obesity, unspecified: Secondary | ICD-10-CM | POA: Diagnosis not present

## 2021-07-01 NOTE — Progress Notes (Signed)
Sleep Medicine   Office Visit  Patient Name: Louis Edwards DOB: 1986/03/16 MRN [id number]    Chief Complaint: initial evaluation  Brief History:  Louis Edwards presents with a staff person from [employer] with a long history of snoring, obesity, autism and anxiety.. This is worse in the supine position.   Sleep quality is poor. This is noted most nights. he patient relates the following symptoms: observed apneas and choking are also present. He does complain of morning headaches from time to time. The patient goes to sleep at 730 pm and wakes up at 6 am.  he reports that his sleep quality is poor and wakes once nightly for bathroom, but has to read for a long time before he can go back to sleep..  Sleep quality is still poor when outside home environment.  Patient has noted kicking of his legs at night.  The patient  relates has lifelike nightmares behavior during the night.  The patient does have  a history of psychiatric problems (psychosis, and has an intellectual disability) . The Epworth Sleepiness Score is 12 out of 24 .  The patient relates  Cardiovascular risk factors include: hypertension.     ROS  General: (-) fever, (-) chills, (-) night sweat Nose and Sinuses: (-) nasal stuffiness or itchiness, (-) postnasal drip, (-) nosebleeds, (-) sinus trouble. Mouth and Throat: (-) sore throat, (-) hoarseness. Neck: (-) swollen glands, (-) enlarged thyroid, (-) neck pain. Respiratory: - cough, - shortness of breath, - wheezing. Neurologic: - numbness, - tingling. Psychiatric: - anxiety, - depression Sleep behavior: -sleep paralysis -hypnogogic hallucinations -dream enactment      -vivid dreams -cataplexy -night terrors -sleep walking   Current Medication: Outpatient Encounter Medications as of 07/01/2021  Medication Sig   albuterol (VENTOLIN HFA) 108 (90 Base) MCG/ACT inhaler    albuterol (VENTOLIN HFA) 108 (90 Base) MCG/ACT inhaler Inhale into the lungs.   clonazePAM (KLONOPIN) 1 MG  tablet Take 1 mg by mouth as needed for anxiety.   divalproex (DEPAKOTE ER) 500 MG 24 hr tablet Take 500 mg by mouth daily.   divalproex (DEPAKOTE) 500 MG DR tablet Take 500 mg by mouth 2 (two) times daily.   DULoxetine (CYMBALTA) 30 MG capsule Take 30 mg by mouth daily.   haloperidol (HALDOL) 5 MG tablet Take 5 mg by mouth 2 (two) times daily.   haloperidol decanoate (HALDOL DECANOATE) 100 MG/ML injection Inject into the muscle.   ibuprofen (ADVIL) 800 MG tablet Take 800 mg by mouth every 6 (six) hours as needed.   lisinopril (ZESTRIL) 20 MG tablet Take 20 mg by mouth daily.   lithium carbonate 150 MG capsule Take 150 mg by mouth 2 (two) times daily with a meal.   OLANZapine (ZYPREXA) 20 MG tablet Take 20 mg by mouth at bedtime.   omeprazole (PRILOSEC) 20 MG capsule Take 20 mg by mouth daily.   thiothixene (NAVANE) 10 MG capsule Take 10 mg by mouth 3 (three) times daily.   trihexyphenidyl (ARTANE) 2 MG tablet Take 2 mg by mouth 3 (three) times daily with meals.   Vibegron (GEMTESA) 75 MG TABS Take 75 mg by mouth daily.   No facility-administered encounter medications on file as of 07/01/2021.    Surgical History: History reviewed. No pertinent surgical history.  Medical History: Past Medical History:  Diagnosis Date   Hyperactivity disorder    Moderate intellectual disability    Oppositional defiant disorder     Family History: Non contributory to the present  illness  Social History: Social History   Socioeconomic History   Marital status: Single    Spouse name: Not on file   Number of children: Not on file   Years of education: Not on file   Highest education level: Not on file  Occupational History   Not on file  Tobacco Use   Smoking status: Never   Smokeless tobacco: Never  Substance and Sexual Activity   Alcohol use: Never   Drug use: Never   Sexual activity: Not on file  Other Topics Concern   Not on file  Social History Narrative   Not on file   Social  Determinants of Health   Financial Resource Strain: Not on file  Food Insecurity: Not on file  Transportation Needs: Not on file  Physical Activity: Not on file  Stress: Not on file  Social Connections: Not on file  Intimate Partner Violence: Not on file    Vital Signs: Blood pressure (!) 146/88, pulse (!) 104, resp. rate 16, height 5\' 6"  (1.676 m), weight 226 lb 4.8 oz (102.6 kg), SpO2 95 %.  Examination: General Appearance: The patient is well-developed, well-nourished, and in no distress. Neck Circumference: 44cm Skin: Gross inspection of skin unremarkable. Head: normocephalic, no gross deformities. Eyes: no gross deformities noted. ENT: ears appear grossly normal Neurologic: Alert and oriented. No involuntary movements.    EPWORTH SLEEPINESS SCALE:  Scale:  (0)= no chance of dozing; (1)= slight chance of dozing; (2)= moderate chance of dozing; (3)= high chance of dozing  Chance  Situtation    Sitting and reading: 0    Watching TV: 2    Sitting Inactive in public: 3    As a passenger in car: 2      Lying down to rest: 2    Sitting and talking: 1    Sitting quielty after lunch: 2    In a car, stopped in traffic: 0   TOTAL SCORE:   12 out of 24    SLEEP STUDIES:  No studies on file   LABS: Recent Results (from the past 2160 hour(s))  BLADDER SCAN AMB NON-IMAGING     Status: None   Collection Time: 05/02/21 10:07 AM  Result Value Ref Range   Scan Result 41 ml     Radiology: DG Ankle Complete Left  Result Date: 04/27/2021 CLINICAL DATA:  Left ankle injury post twisting injury earlier today. Pain on posterior aspect of ankle which radiates superiorly. EXAM: LEFT ANKLE COMPLETE - 3+ VIEW COMPARISON:  None. FINDINGS: Small irregular bone fragment projects adjacent to the posterior aspect of the distal tibia on the lateral view consistent with a fracture. The fracture origin is unclear. There is no other evidence of a fracture. Ankle joint is normally  spaced and aligned. No arthropathic changes. There is diffuse surrounding soft tissue swelling. Moderate-sized plantar and dorsal calcaneal spurs. IMPRESSION: 1. Small fracture along the posterior margin the distal tibia seen on the lateral view. There origin of this fracture fragment unclear. There is no other evidence of a fracture. Diffuse surrounding soft tissue swelling. Electronically Signed   By: 04/29/2021 M.D.   On: 04/27/2021 15:20    No results found.  No results found.    Assessment and Plan: Patient Active Problem List   Diagnosis Date Noted   Psychosis (HCC) 07/01/2021   OSA (obstructive sleep apnea) 07/01/2021   Obesity (BMI 30-39.9) 07/01/2021   Hypertension 07/01/2021   Moderate intellectual disability    Intermittent explosive  disorder 07/08/2020  1. OSA (obstructive sleep apnea) PLAN OSA:   Patient evaluation suggests high risk of sleep disordered breathing due to obesity, snoring, observed apneas and choking, daytime somnolence and morning headaches.  Patient has comorbid cardiovascular risk factors including: hypertension which could be exacerbated by pathologic sleep-disordered breathing.  Suggest: PSG to assess the patient's sleep disordered breathing. He has a cognitive disability and will require a caregiver present. The patient was also counselled on weight loss to optimize sleep health.   2. Psychosis, unspecified psychosis type (HCC) Continue treatment per psychiatry. Will require caregiver for psg.   3. Obesity (BMI 30-39.9) Obesity Counseling: Had a lengthy discussion regarding patients BMI and weight issues. Patient was instructed on portion control as well as increased activity. Also discussed caloric restrictions with trying to maintain intake less than 2000 Kcal. Discussions were made in accordance with the 5As of weight management. Simple actions such as not eating late and if able to, taking a walk is suggested.   4. Hypertension, unspecified  type Hypertension Counseling:   The following hypertensive lifestyle modification were recommended and discussed:  1. Limiting alcohol intake to less than 1 oz/day of ethanol:(24 oz of beer or 8 oz of wine or 2 oz of 100-proof whiskey). 2. Take baby ASA 81 mg daily. 3. Importance of regular aerobic exercise and losing weight. 4. Reduce dietary saturated fat and cholesterol intake for overall cardiovascular health. 5. Maintaining adequate dietary potassium, calcium, and magnesium intake. 6. Regular monitoring of the blood pressure. 7. Reduce sodium intake to less than 100 mmol/day (less than 2.3 gm of sodium or less than 6 gm of sodium choride)       General Counseling: I have discussed the findings of the evaluation and examination with Ethelene Browns.  I have also discussed any further diagnostic evaluation thatmay be needed or ordered today. Wyett verbalizes understanding of the findings of todays visit. We also reviewed his medications today and discussed drug interactions and side effects including but not limited excessive drowsiness and altered mental states. We also discussed that there is always a risk not just to him but also people around him. he has been encouraged to call the office with any questions or concerns that should arise related to todays visit.  No orders of the defined types were placed in this encounter.       I have personally obtained a history, evaluated the patient, evaluated pertinent data, formulated the assessment and plan and placed orders.   This patient was seen today by Emmaline Kluver, PA-C in collaboration with Dr. Freda Munro.  Yevonne Pax, MD [facility] Diplomate ABMS Pulmonary and Critical Care Medicine Sleep medicine

## 2021-07-30 ENCOUNTER — Other Ambulatory Visit: Payer: Self-pay | Admitting: Urology

## 2021-08-02 MED ORDER — GEMTESA 75 MG PO TABS: 75.0000 mg | ORAL_TABLET | Freq: Every day | ORAL | 2 refills | Status: DC

## 2021-09-26 IMAGING — CR DG CHEST 2V
2 series · 2 of 2 positions shown · non-contrast
Comparison: Chest radiograph 10/14/2009

CLINICAL DATA: Fever.

EXAM:
CHEST - 2 VIEW

[chest pa]
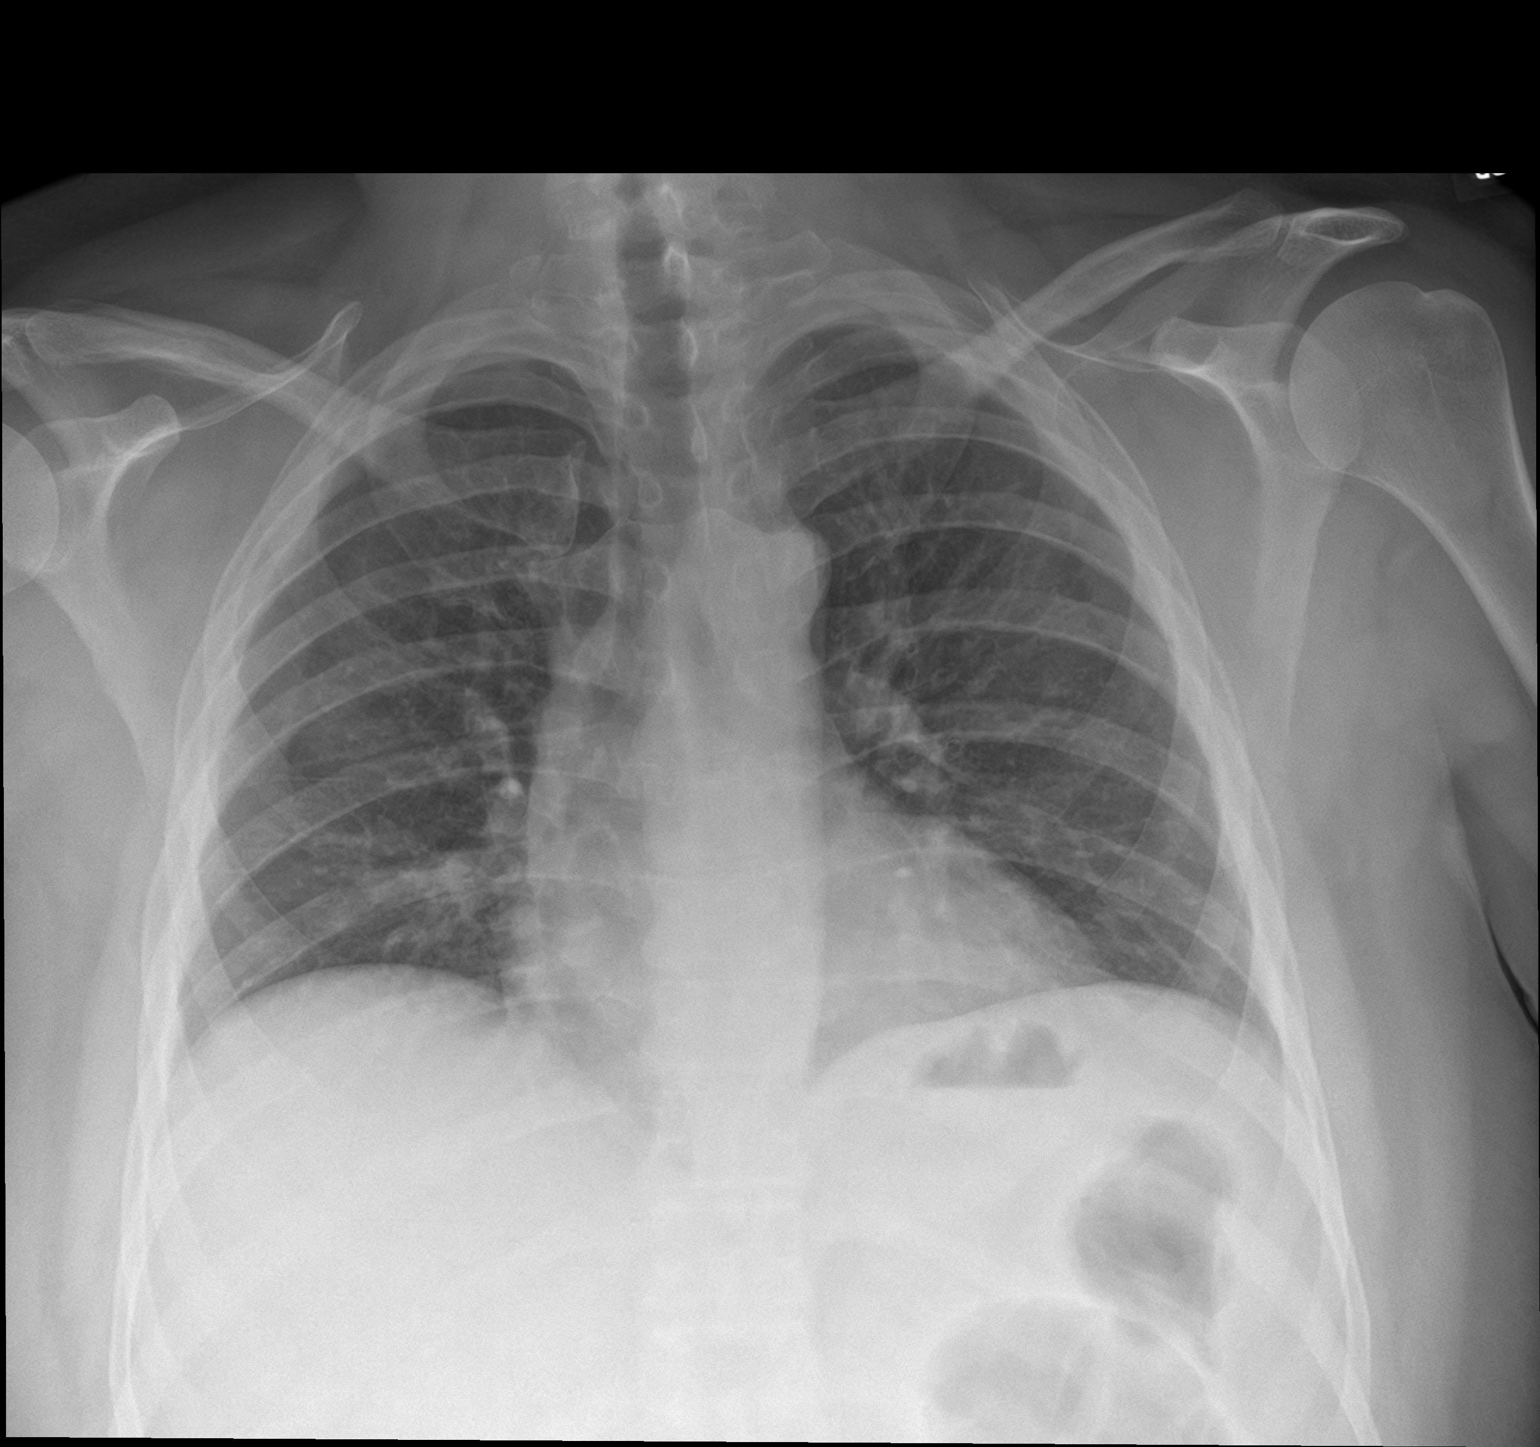

[chest lat]
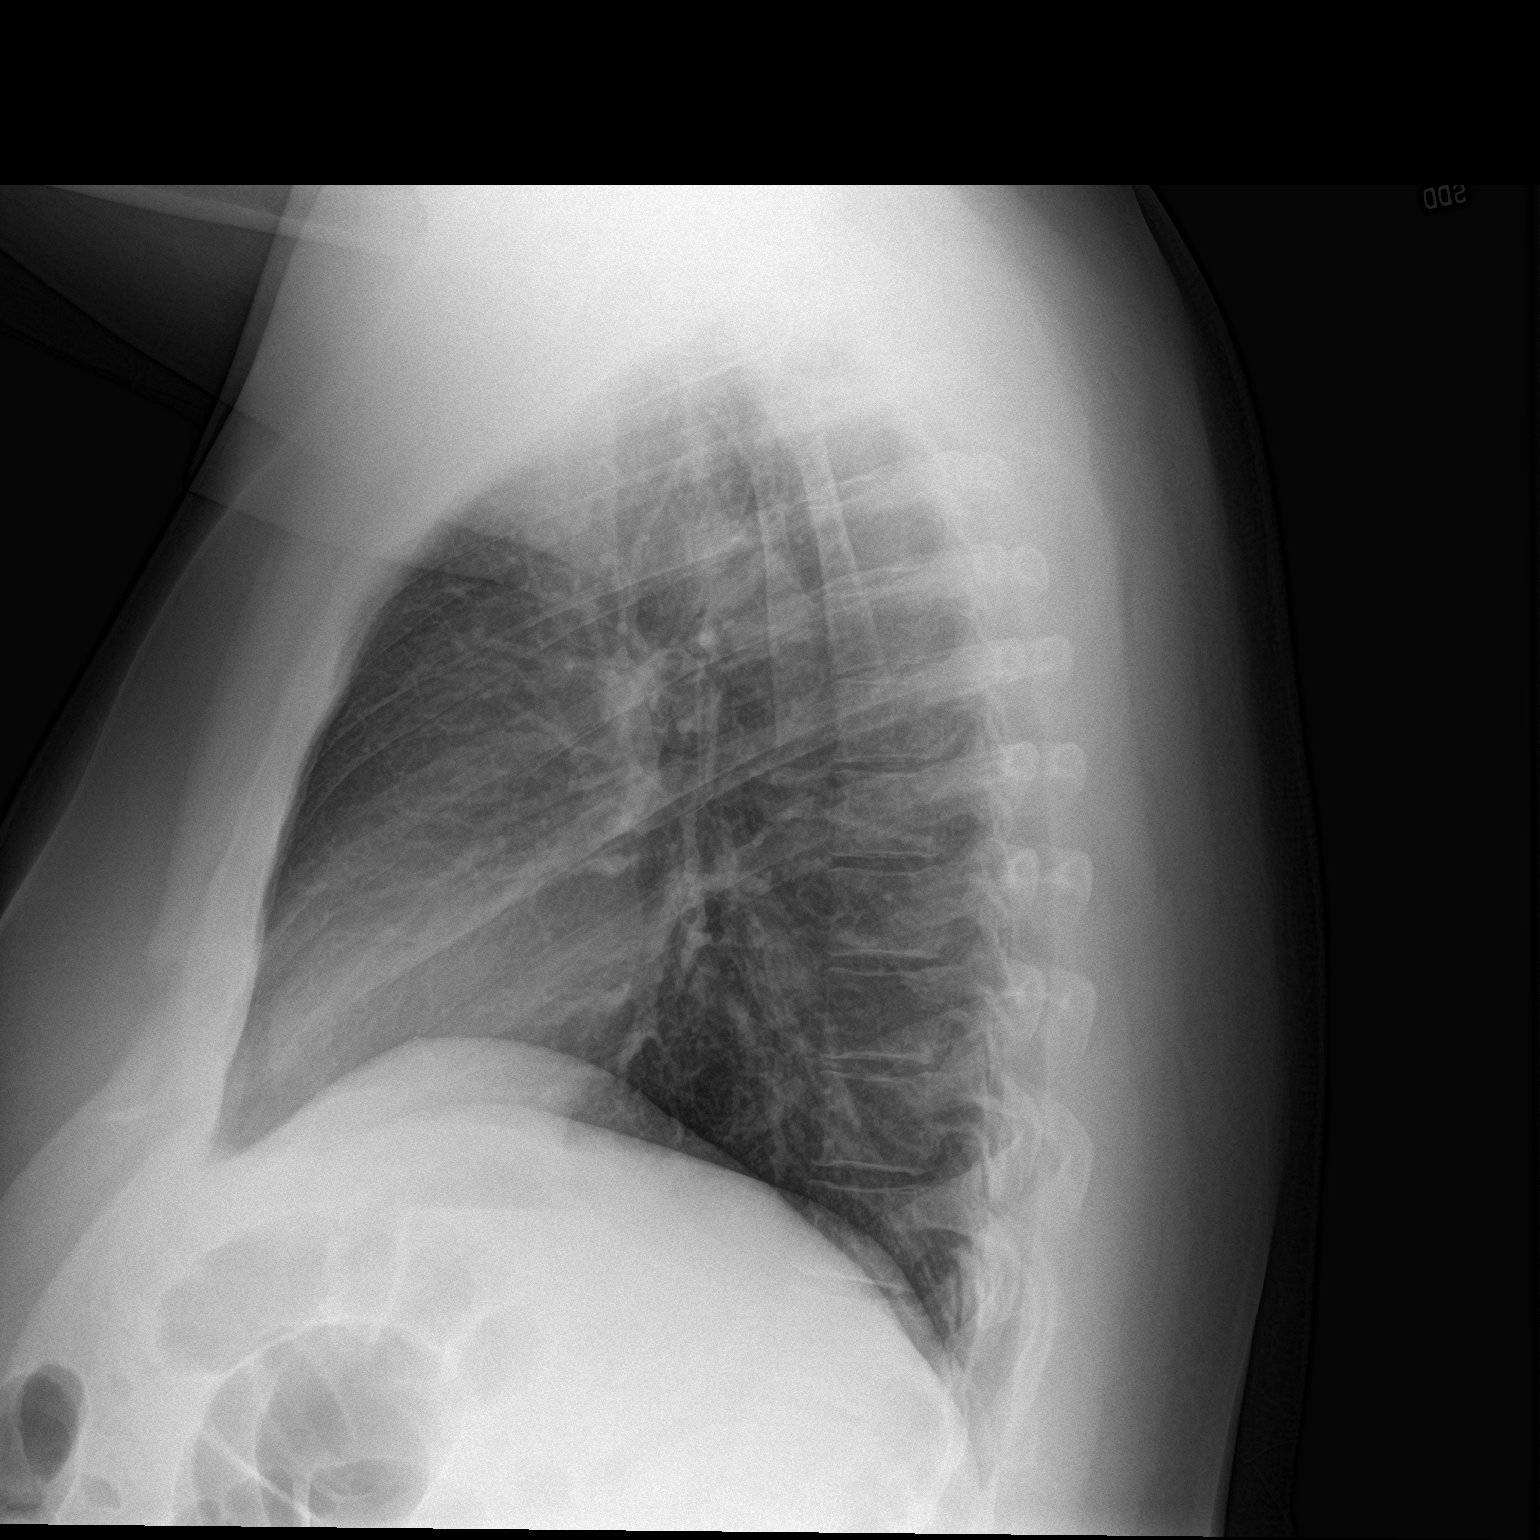

[2 of 2 positions shown; findings below may reference images not displayed]

FINDINGS: Stable cardiomediastinal contours with normal heart size. Low lung
volumes. Lungs are clear. No pneumothorax or pleural effusion. No
acute finding in the visualized skeleton.
IMPRESSION: No evidence of active disease in the chest.

## 2021-10-28 ENCOUNTER — Ambulatory Visit (INDEPENDENT_AMBULATORY_CARE_PROVIDER_SITE_OTHER): Payer: Medicare Other | Admitting: Internal Medicine

## 2021-10-28 VITALS — BP 136/84 | HR 91 | Resp 16 | Ht 66.0 in | Wt 236.0 lb

## 2021-10-28 DIAGNOSIS — Z7189 Other specified counseling: Secondary | ICD-10-CM

## 2021-10-28 DIAGNOSIS — Z9989 Dependence on other enabling machines and devices: Secondary | ICD-10-CM

## 2021-10-28 DIAGNOSIS — I10 Essential (primary) hypertension: Secondary | ICD-10-CM

## 2021-10-28 DIAGNOSIS — G4733 Obstructive sleep apnea (adult) (pediatric): Secondary | ICD-10-CM | POA: Insufficient documentation

## 2021-10-28 NOTE — Patient Instructions (Signed)

## 2021-10-28 NOTE — Progress Notes (Signed)
[facility] [address] [place], [place] [address]  Pulmonary Sleep Medicine   Office Visit Note  Patient Name: Louis Edwards DOB: [birth year] MRN [id number]    Chief Complaint: Obstructive Sleep Apnea visit  Brief History:  Louis Edwards is seen today for follow up appointment. The patient has a 6 month history of sleep apnea. Patient is using PAP most nights.  The patient feels good after sleeping with PAP.  The patient reports benefiting from PAP use. Reported sleepiness is  better and the Epworth Sleepiness Score is 11 out of 24. The patient does ocassionally take naps. The patient complains of the following: water chamber running out of water.   The compliance download shows  compliance with an average use time of 5:40 hours @ 73%. The AHI is 2.5  The patient does not complain of limb movements disrupting sleep.  ROS  General: (-) fever, (-) chills, (-) night sweat Nose and Sinuses: (-) nasal stuffiness or itchiness, (-) postnasal drip, (-) nosebleeds, (-) sinus trouble. Mouth and Throat: (-) sore throat, (-) hoarseness. Neck: (-) swollen glands, (-) enlarged thyroid, (-) neck pain. Respiratory: - cough, - shortness of breath, - wheezing. Neurologic: - numbness, - tingling. Psychiatric: + anxiety, + depression   Current Medication: Outpatient Encounter Medications as of 10/28/2021  Medication Sig   albuterol (VENTOLIN HFA) 108 (90 Base) MCG/ACT inhaler Inhale into the lungs.   albuterol (VENTOLIN HFA) 108 (90 Base) MCG/ACT inhaler    clonazePAM (KLONOPIN) 1 MG tablet Take 1 mg by mouth as needed for anxiety.   divalproex (DEPAKOTE ER) 500 MG 24 hr tablet Take 500 mg by mouth daily.   divalproex (DEPAKOTE) 500 MG DR tablet Take 500 mg by mouth 2 (two) times daily.   DULoxetine (CYMBALTA) 30 MG capsule Take 30 mg by mouth daily.   haloperidol (HALDOL) 5 MG tablet Take 5 mg by mouth 2 (two) times daily.   haloperidol decanoate (HALDOL DECANOATE) 100 MG/ML injection  Inject into the muscle.   ibuprofen (ADVIL) 800 MG tablet Take 800 mg by mouth every 6 (six) hours as needed.   lisinopril (ZESTRIL) 20 MG tablet Take 20 mg by mouth daily.   lithium carbonate 150 MG capsule Take 150 mg by mouth 2 (two) times daily with a meal.   OLANZapine (ZYPREXA) 20 MG tablet Take 20 mg by mouth at bedtime.   omeprazole (PRILOSEC) 20 MG capsule Take 20 mg by mouth daily.   thiothixene (NAVANE) 10 MG capsule Take 10 mg by mouth 3 (three) times daily.   trihexyphenidyl (ARTANE) 2 MG tablet Take 2 mg by mouth 3 (three) times daily with meals.   Vibegron (GEMTESA) 75 MG TABS Take 75 mg by mouth daily.   No facility-administered encounter medications on file as of 10/28/2021.    Surgical History: History reviewed. No pertinent surgical history.  Medical History: Past Medical History:  Diagnosis Date   Hyperactivity disorder    Moderate intellectual disability    Oppositional defiant disorder     Family History: Non contributory to the present illness  Social History: Social History   Socioeconomic History   Marital status: Single    Spouse name: Not on file   Number of children: Not on file   Years of education: Not on file   Highest education level: Not on file  Occupational History   Not on file  Tobacco Use   Smoking status: Never   Smokeless tobacco: Never  Substance and Sexual Activity  Alcohol use: Never   Drug use: Never   Sexual activity: Not on file  Other Topics Concern   Not on file  Social History Narrative   Not on file   Social Determinants of Health   Financial Resource Strain: Not on file  Food Insecurity: Not on file  Transportation Needs: Not on file  Physical Activity: Not on file  Stress: Not on file  Social Connections: Not on file  Intimate Partner Violence: Not on file    Vital Signs: Blood pressure 136/84, pulse 91, resp. rate 16, height 5\' 6"  (1.676 m), weight 236 lb (107 kg), SpO2 94 %. Body mass index is 38.09  kg/m.    Examination: General Appearance: The patient is well-developed, well-nourished, and in no distress. Neck Circumference: 44 cm Skin: Gross inspection of skin unremarkable. Head: normocephalic, no gross deformities. Eyes: no gross deformities noted. ENT: ears appear grossly normal Neurologic: Alert and oriented. No involuntary movements.    EPWORTH SLEEPINESS SCALE:  Scale:  (0)= no chance of dozing; (1)= slight chance of dozing; (2)= moderate chance of dozing; (3)= high chance of dozing  Chance  Situtation    Sitting and reading: 0    Watching TV: 3    Sitting Inactive in public: 1    As a passenger in car: 3      Lying down to rest: 1    Sitting and talking: 1    Sitting quielty after lunch: 2    In a car, stopped in traffic: 0   TOTAL SCORE:   11 out of 24    SLEEP STUDIES:  PSG 07/16/21 -   overall AHI 17.1, Rem and suine AHIs were 52.2 And 24.5 Respectively,  min SpO2@ 77%   CPAP COMPLIANCE DATA:  Date Range: 09/28/21 - 10/27/21  Average Daily Use: 5:40 hours  Median Use: 6:43 hours  Compliance for > 4 Hours: 73%  AHI: 2.5 respiratory events per hour  Days Used: 26/30  Mask Leak: 47.3 lpm  95th Percentile Pressure: 14.3 cmH2O    LABS: No results found for this or any previous visit (from the past 2160 hour(s)).  Radiology: DG Ankle Complete Left  Result Date: 04/27/2021 CLINICAL DATA:  Left ankle injury post twisting injury earlier today. Pain on posterior aspect of ankle which radiates superiorly. EXAM: LEFT ANKLE COMPLETE - 3+ VIEW COMPARISON:  None. FINDINGS: Small irregular bone fragment projects adjacent to the posterior aspect of the distal tibia on the lateral view consistent with a fracture. The fracture origin is unclear. There is no other evidence of a fracture. Ankle joint is normally spaced and aligned. No arthropathic changes. There is diffuse surrounding soft tissue swelling. Moderate-sized plantar and dorsal  calcaneal spurs. IMPRESSION: 1. Small fracture along the posterior margin the distal tibia seen on the lateral view. There origin of this fracture fragment unclear. There is no other evidence of a fracture. Diffuse surrounding soft tissue swelling. Electronically Signed   By: Lajean Manes M.D.   On: 04/27/2021 15:20    No results found.  No results found.    Assessment and Plan: Patient Active Problem List   Diagnosis Date Noted   OSA on CPAP 10/28/2021   CPAP use counseling 10/28/2021   Psychosis ([place]) 07/01/2021   OSA (obstructive sleep apnea) 07/01/2021   Obesity (BMI 30-39.9) 07/01/2021   Hypertension 07/01/2021   Moderate intellectual disability    Intermittent explosive disorder 07/08/2020   1. OSA on CPAP The patient does tolerate PAP and  reports  benefit from PAP use. The patient was reminded how to clean equipment and advised to replace supplies routinely. The patient was also counselled on weight loss. Given the water chamber is running out we will try a different seal.  The compliance is good. The AHI is 2.5.   OSA- increase compliance with pap. F/u 15m   2. CPAP use counseling Pt reports good compliance with CPAP therapy. Cleaning machine by hand, and changing filters and tubing as directed. Denies headaches, sinus issues, palpitations, or hemoptysis.     3. Hypertension, unspecified type Hypertension Counseling:   The following hypertensive lifestyle modification were recommended and discussed:  1. Limiting alcohol intake to less than 1 oz/day of ethanol:(24 oz of beer or 8 oz of wine or 2 oz of 100-proof whiskey). 2. Take baby ASA 81 mg daily. 3. Importance of regular aerobic exercise and losing weight. 4. Reduce dietary saturated fat and cholesterol intake for overall cardiovascular health. 5. Maintaining adequate dietary potassium, calcium, and magnesium intake. 6. Regular monitoring of the blood pressure. 7. Reduce sodium intake to less than 100 mmol/day (less  than 2.3 gm of sodium or less than 6 gm of sodium choride)      General Counseling: I have discussed the findings of the evaluation and examination with Louis Edwards.  I have also discussed any further diagnostic evaluation thatmay be needed or ordered today. Louis Edwards verbalizes understanding of the findings of todays visit. We also reviewed his medications today and discussed drug interactions and side effects including but not limited excessive drowsiness and altered mental states. We also discussed that there is always a risk not just to him but also people around him. he has been encouraged to call the office with any questions or concerns that should arise related to todays visit.  No orders of the defined types were placed in this encounter.       I have personally obtained a history, examined the patient, evaluated laboratory and imaging results, formulated the assessment and plan and placed orders. This patient was seen today by Tressie Ellis, PA-C in collaboration with Dr. Devona Konig.   Allyne Gee, MD [facility] Diplomate ABMS Pulmonary Critical Care Medicine and Sleep Medicine

## 2021-11-22 ENCOUNTER — Other Ambulatory Visit: Payer: Self-pay | Admitting: *Deleted

## 2021-11-22 MED ORDER — GEMTESA 75 MG PO TABS: 75.0000 mg | ORAL_TABLET | Freq: Every day | ORAL | 11 refills | Status: DC

## 2022-02-06 ENCOUNTER — Encounter: Payer: Self-pay | Admitting: Pulmonary Disease

## 2022-02-06 ENCOUNTER — Ambulatory Visit
Admission: RE | Admit: 2022-02-06 | Discharge: 2022-02-06 | Disposition: A | Discharge status: Home / Self Care | Payer: Medicare Other | Attending: Pulmonary Disease | Admitting: Pulmonary Disease

## 2022-02-06 ENCOUNTER — Ambulatory Visit (INDEPENDENT_AMBULATORY_CARE_PROVIDER_SITE_OTHER): Payer: Medicare Other | Admitting: Pulmonary Disease

## 2022-02-06 ENCOUNTER — Ambulatory Visit
Admission: RE | Admit: 2022-02-06 | Discharge: 2022-02-06 | Disposition: A | Discharge status: Home / Self Care | Payer: Medicare Other | Source: Ambulatory Visit | Attending: Pulmonary Disease | Admitting: Pulmonary Disease

## 2022-02-06 ENCOUNTER — Other Ambulatory Visit
Admission: RE | Admit: 2022-02-06 | Discharge: 2022-02-06 | Disposition: A | Discharge status: Home / Self Care | Payer: Medicare Other | Source: Home / Self Care | Attending: Pulmonary Disease | Admitting: Pulmonary Disease

## 2022-02-06 VITALS — BP 132/78 | HR 78 | Temp 97.8°F | Ht 66.0 in | Wt 233.6 lb

## 2022-02-06 DIAGNOSIS — R0609 Other forms of dyspnea: Secondary | ICD-10-CM

## 2022-02-06 LAB — COMPREHENSIVE METABOLIC PANEL
ALT: 26 U/L (ref 0–44)
AST: 20 U/L (ref 15–41)
Albumin: 4.6 g/dL (ref 3.5–5.0)
Alkaline Phosphatase: 91 U/L (ref 38–126)
Anion gap: 9 (ref 5–15)
BUN: 10 mg/dL (ref 6–20)
CO2: 23 mmol/L (ref 22–32)
Calcium: 10.1 mg/dL (ref 8.9–10.3)
Chloride: 104 mmol/L (ref 98–111)
Creatinine, Ser: 0.86 mg/dL (ref 0.61–1.24)
GFR, Estimated: >60 mL/min (ref >60)
Glucose, Bld: 93 mg/dL (ref 70–99)
Potassium: 4.5 mmol/L (ref 3.5–5.1)
Sodium: 136 mmol/L (ref 135–145)
Total Bilirubin: 0.9 mg/dL (ref 0.3–1.2)
Total Protein: 7.5 g/dL (ref 6.5–8.1)

## 2022-02-06 LAB — D-DIMER, QUANTITATIVE: D-Dimer, Quant: <0.27 ug/mL-FEU (ref 0.00–0.50)

## 2022-02-06 LAB — CBC WITH DIFFERENTIAL/PLATELET
Abs Immature Granulocytes: 0.04 10*3/uL (ref 0.00–0.07)
Basophils Absolute: 0 10*3/uL (ref 0.0–0.1)
Basophils Relative: 0 %
Eosinophils Absolute: 0 10*3/uL (ref 0.0–0.5)
Eosinophils Relative: 0 %
HCT: 42.6 % (ref 39.0–52.0)
Hemoglobin: 14.5 g/dL (ref 13.0–17.0)
Immature Granulocytes: 1 %
Lymphocytes Relative: 24 %
Lymphs Abs: 1.6 10*3/uL (ref 0.7–4.0)
MCH: 31.1 pg (ref 26.0–34.0)
MCHC: 34 g/dL (ref 30.0–36.0)
MCV: 91.4 fL (ref 80.0–100.0)
Monocytes Absolute: 0.4 10*3/uL (ref 0.1–1.0)
Monocytes Relative: 6 %
Neutro Abs: 4.7 10*3/uL (ref 1.7–7.7)
Neutrophils Relative %: 69 %
Platelets: 189 10*3/uL (ref 150–400)
RBC: 4.66 MIL/uL (ref 4.22–5.81)
RDW: 13.2 % (ref 11.5–15.5)
WBC: 6.7 10*3/uL (ref 4.0–10.5)
nRBC: 0 % (ref 0.0–0.2)

## 2022-02-06 LAB — BRAIN NATRIURETIC PEPTIDE: B Natriuretic Peptide: 5.4 pg/mL (ref 0.0–100.0)

## 2022-02-06 NOTE — Patient Instructions (Signed)
Chest xray and lab tests today ?Will arrange for echocardiogram and pulmonary function test ?Follow up in 6 to 8 weeks with Dr. Craige Cotta or a Nurse Practitioner ?

## 2022-02-06 NOTE — Progress Notes (Signed)
? ? Pulmonary, Critical Care, and Sleep Medicine ? ?Chief Complaint  ?Patient presents with  ? sleep consult  ?  Wearing cpap avg 8hr nightly-mask is leaking. C/o sob with exertion.   ? ? ?Past Surgical History:  ?He has not had any surgeries. ? ?Past Medical History:  ?Hyperactivity, Oppositional defiant disorder, Intellectual disability, Hypertension, GERD ? ?Constitutional:  ?BP 132/78 (BP Location: Left Arm, Cuff Size: Normal)   Pulse 78   Temp 97.8 ?F (36.6 ?C) (Temporal)   Ht 5\' 6"  (1.676 m)   Wt 233 lb 9.6 oz (106 kg)   SpO2 96%   BMI 37.70 kg/m?  ? ?Brief Summary:  ?Louis Edwards is a 36 y.o. male with dyspnea and obstructive sleep apnea. ?  ? ? ? ?Subjective:  ? ?He resides in a facility, and is here with 31 from the facility. ? ?He was seen previously by Dr. Chales Abrahams for his sleep apnea. ? ?There has been concern about his breathing for a while and this has been getting worse.  He lags behind other residents when they go outside.  He makes wheezing noises when he is short of breath.  He has tried using albuterol, but this doesn't help.  He is not having cough, chest congestion, chest pain, or leg swelling.  He reports using CPAP nightly, and no issue with set up.  He denies seasonal allergies.  No history of pneumonia. ? ?He maintained SpO2 > 95% while walking 3 laps on room air today. ? ?Physical Exam:  ? ?Appearance - well kempt  ? ?ENMT - no sinus tenderness, no oral exudate, no LAN, Mallampati 4 airway, no stridor, protuberant tongue with drooling ? ?Respiratory - equal breath sounds bilaterally, no wheezing or rales ? ?CV - s1s2 regular rate and rhythm, no murmurs ? ?Ext - no clubbing, no edema ? ?Skin - no rashes ? ?Psych - normal mood and affect ?  ?Pulmonary testing:  ? ? ?Chest Imaging:  ? ? ?Sleep Tests:  ?PSG 07/16/21 >> AHI 17.1, SpO2 low 77%, REM AHI 52.2 ? ?Cardiac Tests:  ? ? ?Social History:  ?He  reports that he has never smoked. He has never used smokeless tobacco.  He reports that he does not drink alcohol and does not use drugs. ? ?Family History:  ?His family history is unknown. ?  ? ? ?Assessment/Plan:  ? ?Dyspnea on exertion. ?- uncertain what is causing this, but suspect obesity and deconditioning are playing a significant role ?- will get CBC with diff, CMET, BNP, D dimer, Echo, PFT ? ?Obstructive sleep apnea. ?- he reports compliance with CPAP and benefit from therapy ?- he plans to switch care to [facility] pulmonary ?- continue auto CPAP 5 to 15 cm H2O ? ?Developmental delay. ?- he resides in an assisted living facility ? ?Time Spent Involved in Patient Care on Day of Examination:  ?50 minutes ? ?Follow up:  ? ?Patient Instructions  ?Chest xray and lab tests today ?Will arrange for echocardiogram and pulmonary function test ?Follow up in 6 to 8 weeks with Dr. [facility] or a Nurse Practitioner ? ?Medication List:  ? ?Allergies as of 02/06/2022   ? ?   Reactions  ? Keflex [cephalexin]   ? Risperdal [risperidone]   ? Thorazine [chlorpromazine]   ? ?  ? ?  ?Medication List  ?  ? ?  ? Accurate as of Feb 06, 2022 11:32 AM. If you have any questions, ask your nurse or doctor.  ?  ?  ? ?  ? ?  albuterol 108 (90 Base) MCG/ACT inhaler ?Commonly known as: VENTOLIN HFA ?  ?clonazePAM 1 MG tablet ?Commonly known as: KLONOPIN ?Take 1 mg by mouth as needed for anxiety. ?  ?divalproex 500 MG DR tablet ?Commonly known as: DEPAKOTE ?Take 500 mg by mouth 2 (two) times daily. ?  ?divalproex 500 MG 24 hr tablet ?Commonly known as: DEPAKOTE ER ?Take 500 mg by mouth daily. ?  ?DULoxetine 30 MG capsule ?Commonly known as: CYMBALTA ?Take 30 mg by mouth daily. ?  ?Gemtesa 75 MG Tabs ?Generic drug: Vibegron ?Take 75 mg by mouth daily. ?  ?haloperidol 5 MG tablet ?Commonly known as: HALDOL ?Take 5 mg by mouth 2 (two) times daily. ?  ?haloperidol decanoate 100 MG/ML injection ?Commonly known as: HALDOL DECANOATE ?Inject into the muscle. ?  ?ibuprofen 800 MG tablet ?Commonly known as: ADVIL ?Take 800 mg by  mouth every 6 (six) hours as needed. ?  ?lisinopril 20 MG tablet ?Commonly known as: ZESTRIL ?Take 20 mg by mouth daily. ?  ?lithium carbonate 150 MG capsule ?Take 150 mg by mouth 2 (two) times daily with a meal. ?  ?OLANZapine 20 MG tablet ?Commonly known as: ZYPREXA ?Take 20 mg by mouth at bedtime. ?  ?omeprazole 20 MG capsule ?Commonly known as: PRILOSEC ?Take 20 mg by mouth daily. ?  ?thiothixene 10 MG capsule ?Commonly known as: NAVANE ?Take 10 mg by mouth 3 (three) times daily. ?  ?trihexyphenidyl 2 MG tablet ?Commonly known as: ARTANE ?Take 2 mg by mouth 3 (three) times daily with meals. ?  ? ?  ? ? ?Signature:  ?Coralyn Helling, MD ?[facility]/Critical [facility] ?Pager - [phone] - [phone] ?02/06/2022, 11:32 AM ?  ? ? ? ? ? ? ? ? ?

## 2022-02-10 ENCOUNTER — Telehealth: Payer: Self-pay

## 2022-02-10 NOTE — Telephone Encounter (Signed)
DL received from [facility] and faxed to Dr. Halford Chessman at [facility] office for review. ? ?Routing to Dr. Halford Chessman to make aware.  ?

## 2022-02-12 NOTE — Telephone Encounter (Signed)
Lm for patient's care giver at [facility]. ?

## 2022-02-12 NOTE — Telephone Encounter (Signed)
ONO with CPAP 01/08/22 to 02/06/22 >> used on 25 of 30 nights with average 8 hrs 23 min.  Average AHI 4.2 with median CPAP 12 and 95 th percentile CPAP 15 cm H2O ? ? ?Please let him know his CPAP report shows good control of his sleep apnea with auto CPAP 7 to 16 cm H2O. ?

## 2022-02-13 NOTE — Telephone Encounter (Signed)
Spoke to unknown male at [facility]. Was advised to call back after 9:00a when QP is available.   ?

## 2022-02-14 NOTE — Telephone Encounter (Signed)
Patient's Mother, Madelin Rear) is aware of below results and her understanding.  ?Nothing further needed.  ? ?

## 2022-02-14 NOTE — Telephone Encounter (Signed)
Lm for patient's mother, Susan(DPR). ?Will close encounter per office protocol.  ? ? ?

## 2022-02-24 ENCOUNTER — Ambulatory Visit (INDEPENDENT_AMBULATORY_CARE_PROVIDER_SITE_OTHER): Payer: Medicare Other | Admitting: Internal Medicine

## 2022-02-24 VITALS — BP 115/81 | HR 89 | Resp 16 | Ht 66.0 in | Wt 236.0 lb

## 2022-02-24 DIAGNOSIS — G4733 Obstructive sleep apnea (adult) (pediatric): Secondary | ICD-10-CM

## 2022-02-24 DIAGNOSIS — I10 Essential (primary) hypertension: Secondary | ICD-10-CM

## 2022-02-24 DIAGNOSIS — Z7189 Other specified counseling: Secondary | ICD-10-CM | POA: Diagnosis not present

## 2022-02-24 DIAGNOSIS — Z9989 Dependence on other enabling machines and devices: Secondary | ICD-10-CM | POA: Diagnosis not present

## 2022-02-24 NOTE — Patient Instructions (Signed)

## 2022-02-24 NOTE — Progress Notes (Signed)
[facility] [place], [place] [address]  Pulmonary Sleep Medicine   Office Visit Note  Patient Name: Louis Edwards DOB: 30-Mar-1986 MRN [id number]    Chief Complaint: Obstructive Sleep Apnea visit  Brief History:  Louis Edwards is seen today for follow up. The patient has a 10 month history of sleep apnea. Patient is mostly  using PAP nightly with medium Airtouch foam full face.  The patient feels good after sleeping with PAP.  Epworth Sleepiness Score is 17 out of 24. The patient does not take naps. The patient complains of the following: mask leaking.  The compliance download shows  compliance with an average use time of 6:53  hours@ 74%. The AHI is 3.3  The patient does not complain of limb movements disrupting sleep.  ROS  General: (-) fever, (-) chills, (-) night sweat Nose and Sinuses: (-) nasal stuffiness or itchiness, (-) postnasal drip, (-) nosebleeds, (-) sinus trouble. Mouth and Throat: (-) sore throat, (-) hoarseness. Neck: (-) swollen glands, (-) enlarged thyroid, (-) neck pain. Respiratory: - cough, - shortness of breath, + wheezing (does respond to inhaler)  Neurologic: - numbness, - tingling. Psychiatric: + anxiety, + depression   Current Medication: Outpatient Encounter Medications as of 02/24/2022  Medication Sig   albuterol (VENTOLIN HFA) 108 (90 Base) MCG/ACT inhaler    clonazePAM (KLONOPIN) 1 MG tablet Take 1 mg by mouth as needed for anxiety.   divalproex (DEPAKOTE ER) 500 MG 24 hr tablet Take 500 mg by mouth daily.   divalproex (DEPAKOTE) 500 MG DR tablet Take 500 mg by mouth 2 (two) times daily.   DULoxetine (CYMBALTA) 30 MG capsule Take 30 mg by mouth daily.   haloperidol (HALDOL) 5 MG tablet Take 5 mg by mouth 2 (two) times daily.   haloperidol decanoate (HALDOL DECANOATE) 100 MG/ML injection Inject into the muscle.   ibuprofen (ADVIL) 800 MG tablet Take 800 mg by mouth every 6 (six) hours as needed.   lisinopril (ZESTRIL) 20 MG  tablet Take 20 mg by mouth daily.   lithium carbonate 150 MG capsule Take 150 mg by mouth 2 (two) times daily with a meal.   OLANZapine (ZYPREXA) 20 MG tablet Take 20 mg by mouth at bedtime.   omeprazole (PRILOSEC) 20 MG capsule Take 20 mg by mouth daily.   thiothixene (NAVANE) 10 MG capsule Take 10 mg by mouth 3 (three) times daily.   trihexyphenidyl (ARTANE) 2 MG tablet Take 2 mg by mouth 3 (three) times daily with meals.   Vibegron (GEMTESA) 75 MG TABS Take 75 mg by mouth daily.   No facility-administered encounter medications on file as of 02/24/2022.    Surgical History: History reviewed. No pertinent surgical history.  Medical History: Past Medical History:  Diagnosis Date   Hyperactivity disorder    Moderate intellectual disability    Oppositional defiant disorder     Family History: Non contributory to the present illness  Social History: Social History   Socioeconomic History   Marital status: Single    Spouse name: Not on file   Number of children: Not on file   Years of education: Not on file   Highest education level: Not on file  Occupational History   Not on file  Tobacco Use   Smoking status: Never   Smokeless tobacco: Never  Substance and Sexual Activity   Alcohol use: Never   Drug use: Never   Sexual activity: Not on file  Other Topics Concern   Not  on file  Social History Narrative   Not on file   Social Determinants of Health   Financial Resource Strain: Not on file  Food Insecurity: Not on file  Transportation Needs: Not on file  Physical Activity: Not on file  Stress: Not on file  Social Connections: Not on file  Intimate Partner Violence: Not on file    Vital Signs: Blood pressure 115/81, pulse 89, resp. rate 16, height 5' 6"  (1.676 m), weight 236 lb (107 kg), SpO2 98 %. Body mass index is 38.09 kg/m.    Examination: General Appearance: The patient is well-developed, well-nourished, and in no distress. Neck Circumference: 44  cm Skin: Gross inspection of skin unremarkable. Head: normocephalic, no gross deformities. Eyes: no gross deformities noted. ENT: ears appear grossly normal Neurologic: Alert and oriented. No involuntary movements.    EPWORTH SLEEPINESS SCALE:  Scale:  (0)= no chance of dozing; (1)= slight chance of dozing; (2)= moderate chance of dozing; (3)= high chance of dozing  Chance  Situtation    Sitting and reading: 1    Watching TV: 3    Sitting Inactive in public: 3    As a passenger in car: 2      Lying down to rest: 3    Sitting and talking: 1    Sitting quielty after lunch: 3    In a car, stopped in traffic: 1   TOTAL SCORE:   17 out of 24    SLEEP STUDIES:  PSG 07/16/21 -   overall AHI 17.1, Rem and suine AHIs were 52.2 And 24.5 Respectively,  min SpO2@ 77%   CPAP COMPLIANCE DATA:  Date Range: 10/24/21 -02/20/22  Average Daily Use: 6:53 hours  Median Use: 9:01 hours  Compliance for > 4 Hours: 74% days  AHI: 3.3 respiratory events per hour  Days Used: 97/120  Mask Leak: 65.4 lpm  95th Percentile Pressure: 14.2 cmH2O    LABS: Recent Results (from the past 2160 hour(s))  B Nat Peptide     Status: None   Collection Time: 02/06/22 12:21 PM  Result Value Ref Range   B Natriuretic Peptide 5.4 0.0 - 100.0 pg/mL    Comment: Performed at [facility], [address] [place], [place] [address]  D-Dimer, Quantitative     Status: None   Collection Time: 02/06/22 12:21 PM  Result Value Ref Range   D-Dimer, Quant <0.27 0.00 - 0.50 ug/mL-FEU    Comment: (NOTE) At the manufacturer cut-off value of 0.5 g/mL FEU, this assay has a negative predictive value of 95-100%.This assay is intended for use in conjunction with a clinical pretest probability (PTP) assessment model to exclude pulmonary embolism (PE) and deep venous thrombosis (DVT) in outpatients suspected of PE or DVT. Results should be correlated with clinical presentation. Performed at  [facility], [address] [place], [place] [address]   Comp Met (CMET)     Status: None   Collection Time: 02/06/22 12:21 PM  Result Value Ref Range   Sodium 136 135 - 145 mmol/L   Potassium 4.5 3.5 - 5.1 mmol/L   Chloride 104 98 - 111 mmol/L   CO2 23 22 - 32 mmol/L   Glucose, Bld 93 70 - 99 mg/dL    Comment: Glucose reference range applies only to samples taken after fasting for at least 8 hours.   BUN 10 6 - 20 mg/dL   Creatinine, Ser 0.86 0.61 - 1.24 mg/dL   Calcium 10.1 8.9 - 10.3 mg/dL  Total Protein 7.5 6.5 - 8.1 g/dL   Albumin 4.6 3.5 - 5.0 g/dL   AST 20 15 - 41 U/L   ALT 26 0 - 44 U/L   Alkaline Phosphatase 91 38 - 126 U/L   Total Bilirubin 0.9 0.3 - 1.2 mg/dL   GFR, Estimated >60 >60 mL/min    Comment: (NOTE) Calculated using the CKD-EPI Creatinine Equation (2021)    Anion gap 9 5 - 15    Comment: Performed at [facility], [address] [place], [place] [address]  CBC w/Diff     Status: None   Collection Time: 02/06/22 12:21 PM  Result Value Ref Range   WBC 6.7 4.0 - 10.5 K/uL   RBC 4.66 4.22 - 5.81 MIL/uL   Hemoglobin 14.5 13.0 - 17.0 g/dL   HCT 42.6 39.0 - 52.0 %   MCV 91.4 80.0 - 100.0 fL   MCH 31.1 26.0 - 34.0 pg   MCHC 34.0 30.0 - 36.0 g/dL   RDW 13.2 11.5 - 15.5 %   Platelets 189 150 - 400 K/uL   nRBC 0.0 0.0 - 0.2 %   Neutrophils Relative % 69 %   Neutro Abs 4.7 1.7 - 7.7 K/uL   Lymphocytes Relative 24 %   Lymphs Abs 1.6 0.7 - 4.0 K/uL   Monocytes Relative 6 %   Monocytes Absolute 0.4 0.1 - 1.0 K/uL   Eosinophils Relative 0 %   Eosinophils Absolute 0.0 0.0 - 0.5 K/uL   Basophils Relative 0 %   Basophils Absolute 0.0 0.0 - 0.1 K/uL   Immature Granulocytes 1 %   Abs Immature Granulocytes 0.04 0.00 - 0.07 K/uL    Comment: Performed at [facility], [address], [place], [place] [address]    Radiology: DG Chest 2 View  Result Date: 02/06/2022 CLINICAL DATA:  Dyspnea EXAM: CHEST - 2 VIEW COMPARISON:  Chest  x-ray 07/07/2020 FINDINGS: Heart size and mediastinal contours are within normal limits. No suspicious pulmonary opacities identified. No pleural effusion or pneumothorax visualized. No acute osseous abnormality appreciated. IMPRESSION: No acute intrathoracic process identified. Electronically Signed   By: Ofilia Neas M.D.   On: 02/06/2022 16:40    No results found.  DG Chest 2 View  Result Date: 02/06/2022 CLINICAL DATA:  Dyspnea EXAM: CHEST - 2 VIEW COMPARISON:  Chest x-ray 07/07/2020 FINDINGS: Heart size and mediastinal contours are within normal limits. No suspicious pulmonary opacities identified. No pleural effusion or pneumothorax visualized. No acute osseous abnormality appreciated. IMPRESSION: No acute intrathoracic process identified. Electronically Signed   By: Ofilia Neas M.D.   On: 02/06/2022 16:40      Assessment and Plan: Patient Active Problem List   Diagnosis Date Noted   OSA on CPAP 10/28/2021   CPAP use counseling 10/28/2021   Psychosis ([place]) 07/01/2021   OSA (obstructive sleep apnea) 07/01/2021   Obesity (BMI 30-39.9) 07/01/2021   Hypertension 07/01/2021   Moderate intellectual disability    Intermittent explosive disorder 07/08/2020   1. OSA on CPAP Pt is tolerating the cpap and reports benefit from the cpap. He is counseled on strategies to prevent mask leak. Compliance is good. He and his family will make a decision on follow up going forward as he has recently established with a pulmonary (Dr. Halford Chessman) doctor at [facility]. Pt states he would like to continue to follow up with [place] based on today's visit.   2. CPAP use counseling CPAP Counseling: had a lengthy discussion with the patient regarding  the importance of PAP therapy in management of the sleep apnea. Patient appears to understand the risk factor reduction and also understands the risks associated with untreated sleep apnea. Patient will try to make a good faith effort to remain compliant with  therapy. Also instructed the patient on proper cleaning of the device including the water must be changed daily if possible and use of distilled water is preferred. Patient understands that the machine should be regularly cleaned with appropriate recommended cleaning solutions that do not damage the PAP machine for example given white vinegar and water rinses. Other methods such as ozone treatment may not be as good as these simple methods to achieve cleaning.   3. Hypertension, unspecified type Hypertension Counseling:   The following hypertensive lifestyle modification were recommended and discussed:  1. Limiting alcohol intake to less than 1 oz/day of ethanol:(24 oz of beer or 8 oz of wine or 2 oz of 100-proof whiskey). 2. Take baby ASA 81 mg daily. 3. Importance of regular aerobic exercise and losing weight. 4. Reduce dietary saturated fat and cholesterol intake for overall cardiovascular health. 5. Maintaining adequate dietary potassium, calcium, and magnesium intake. 6. Regular monitoring of the blood pressure. 7. Reduce sodium intake to less than 100 mmol/day (less than 2.3 gm of sodium or less than 6 gm of sodium choride)      General Counseling: I have discussed the findings of the evaluation and examination with Louis Edwards.  I have also discussed any further diagnostic evaluation thatmay be needed or ordered today. Louis Edwards verbalizes understanding of the findings of todays visit. We also reviewed his medications today and discussed drug interactions and side effects including but not limited excessive drowsiness and altered mental states. We also discussed that there is always a risk not just to him but also people around him. he has been encouraged to call the office with any questions or concerns that should arise related to todays visit.  No orders of the defined types were placed in this encounter.       I have personally obtained a history, examined the patient, evaluated laboratory  and imaging results, formulated the assessment and plan and placed orders. This patient was seen today by Tressie Ellis, PA-C in collaboration with Dr. Devona Konig.   Allyne Gee, MD [facility] Diplomate ABMS Pulmonary Critical Care Medicine and Sleep Medicine

## 2022-04-10 ENCOUNTER — Encounter: Payer: Self-pay | Admitting: Podiatry

## 2022-04-10 ENCOUNTER — Ambulatory Visit (INDEPENDENT_AMBULATORY_CARE_PROVIDER_SITE_OTHER): Payer: Medicare Other | Admitting: Podiatry

## 2022-04-10 ENCOUNTER — Telehealth: Payer: Self-pay

## 2022-04-10 DIAGNOSIS — B351 Tinea unguium: Secondary | ICD-10-CM

## 2022-04-10 DIAGNOSIS — M79674 Pain in right toe(s): Secondary | ICD-10-CM

## 2022-04-10 DIAGNOSIS — M79675 Pain in left toe(s): Secondary | ICD-10-CM

## 2022-04-10 NOTE — Telephone Encounter (Signed)
According to patient's chart, patient seen Dr. Welton Flakes 02/24/2022 for OSA follow up.  Lm to see if patient wishes to follow with Dr. Welton Flakes or our office.

## 2022-04-10 NOTE — Progress Notes (Signed)
This patient presents to the office with chief complaint of long thick painful nails.  Patient says the nails are painful walking and wearing shoes.  This patient is unable to self treat.  This patient is unable to trim his nails since he is unable to reach his nails.  he presents to the office for preventative foot care services.  General Appearance  Alert, conversant and in no acute stress.  Vascular  Dorsalis pedis and posterior tibial  pulses are palpable  bilaterally.  Capillary return is within normal limits  bilaterally. Temperature is within normal limits  bilaterally.  Neurologic  deferred since patient was unresponsive.  Nails Thick disfigured discolored nails with subungual debris  hallux bilaterally. No evidence of bacterial infection or drainage bilaterally.  Orthopedic  No limitations of motion  feet .  No crepitus or effusions noted.  No bony pathology or digital deformities noted.  Skin  normotropic skin with no porokeratosis noted bilaterally.  No signs of infections or ulcers noted.     Onychomycosis  Nails  B/L.  Pain in right toes  Pain in left toes  Debridement of nails both feet followed trimming the nails with dremel tool.    RTC 4  months.   Helane Gunther DPM

## 2022-04-11 ENCOUNTER — Ambulatory Visit: Payer: Medicare Other | Admitting: Primary Care

## 2022-04-11 NOTE — Telephone Encounter (Signed)
Appt canceled per patient request per epic.  Will close encounter, as nothing further is needed.

## 2022-05-07 NOTE — Progress Notes (Deleted)
05/08/2022 3:59 PM   Dutch Quint [facility] 09-24-1986 [id number]  Referring provider: Anselm Jungling, NP [address] [address] [place],  [place] [address]  Urological history: 1.  Nocturnal enuresis -Contributing factors of obstructive sleep apnea and hypertension -PVR ***  2. Urge incontinence -contributing factors of age and depression -Gemtesa 75 mg daily   No chief complaint on file.  HPI: Louis Edwards is a 36 y.o. male who presents today for yearly follow up.     Score:  1-7 Mild 8-19 Moderate 20-35 Severe     PMH: Past Medical History:  Diagnosis Date   Hyperactivity disorder    Moderate intellectual disability    Oppositional defiant disorder     Surgical History: No past surgical history on file.  Home Medications:  Allergies as of 05/08/2022       Reactions   Keflex [cephalexin]    Risperdal [risperidone]    Thorazine [chlorpromazine]         Medication List        Accurate as of May 07, 2022  3:59 PM. If you have any questions, ask your nurse or doctor.          albuterol 108 (90 Base) MCG/ACT inhaler Commonly known as: VENTOLIN HFA   clonazePAM 1 MG tablet Commonly known as: KLONOPIN Take 1 mg by mouth as needed for anxiety.   divalproex 500 MG DR tablet Commonly known as: DEPAKOTE Take 500 mg by mouth 2 (two) times daily.   divalproex 500 MG 24 hr tablet Commonly known as: DEPAKOTE ER Take 500 mg by mouth daily.   DULoxetine 30 MG capsule Commonly known as: CYMBALTA Take 30 mg by mouth daily.   Gemtesa 75 MG Tabs Generic drug: Vibegron Take 75 mg by mouth daily.   haloperidol 5 MG tablet Commonly known as: HALDOL Take 5 mg by mouth 2 (two) times daily.   haloperidol decanoate 100 MG/ML injection Commonly known as: HALDOL DECANOATE Inject into the muscle.   ibuprofen 800 MG tablet Commonly known as: ADVIL Take 800 mg by mouth every 6 (six) hours as needed.   lisinopril 20 MG tablet Commonly known as: ZESTRIL Take 20 mg  by mouth daily.   lithium carbonate 150 MG capsule Take 150 mg by mouth 2 (two) times daily with a meal.   OLANZapine 20 MG tablet Commonly known as: ZYPREXA Take 20 mg by mouth at bedtime.   omeprazole 20 MG capsule Commonly known as: PRILOSEC Take 20 mg by mouth daily.   thiothixene 10 MG capsule Commonly known as: NAVANE Take 10 mg by mouth 3 (three) times daily.   trihexyphenidyl 2 MG tablet Commonly known as: ARTANE Take 2 mg by mouth 3 (three) times daily with meals.        Allergies:  Allergies  Allergen Reactions   Keflex [Cephalexin]    Risperdal [Risperidone]    Thorazine [Chlorpromazine]     Family History: No family history on file.  Social History:  reports that he has never smoked. He has never used smokeless tobacco. He reports that he does not drink alcohol and does not use drugs.  ROS: Pertinent ROS in HPI  Physical Exam: There were no vitals taken for this visit.  Constitutional:  Well nourished. Alert and oriented, No acute distress. HEENT: [place] AT, moist mucus membranes.  Trachea midline, no masses. Cardiovascular: No clubbing, cyanosis, or edema. Respiratory: Normal respiratory effort, no increased work of breathing. GI: Abdomen is soft, non tender, non distended, no  abdominal masses. Liver and spleen not palpable.  No hernias appreciated.  Stool sample for occult testing is not indicated.   GU: No CVA tenderness.  No bladder fullness or masses.  Patient with circumcised/uncircumcised phallus. ***Foreskin easily retracted***  Urethral meatus is patent.  No penile discharge. No penile lesions or rashes. Scrotum without lesions, cysts, rashes and/or edema.  Testicles are located scrotally bilaterally. No masses are appreciated in the testicles. Left and right epididymis are normal. Rectal: Patient with  normal sphincter tone. Anus and perineum without scarring or rashes. No rectal masses are appreciated. Prostate is approximately *** grams, ***  nodules are appreciated. Seminal vesicles are normal. Skin: No rashes, bruises or suspicious lesions. Lymph: No cervical or inguinal adenopathy. Neurologic: Grossly intact, no focal deficits, moving all 4 extremities. Psychiatric: Normal mood and affect.  Laboratory Data: Lab Results  Component Value Date   WBC 6.7 02/06/2022   HGB 14.5 02/06/2022   HCT 42.6 02/06/2022   MCV 91.4 02/06/2022   PLT 189 02/06/2022    Lab Results  Component Value Date   CREATININE 0.86 02/06/2022    Lab Results  Component Value Date   AST 20 02/06/2022   Lab Results  Component Value Date   ALT 26 02/06/2022  I have reviewed the labs.   Pertinent Imaging: ***  Assessment & Plan:  ***  1. Urge incontinence -***  2.  Nocturnal enuresis ***    No follow-ups on file.  These notes generated with voice recognition software. I apologize for typographical errors.  Michiel Cowboy, PA-C  [facility] [address]  [address] [place], [place] [address] [phone]

## 2022-05-08 ENCOUNTER — Ambulatory Visit: Payer: Medicare Other | Admitting: Urology

## 2022-05-08 DIAGNOSIS — N3944 Nocturnal enuresis: Secondary | ICD-10-CM

## 2022-05-08 DIAGNOSIS — N3941 Urge incontinence: Secondary | ICD-10-CM

## 2022-05-09 ENCOUNTER — Encounter: Payer: Self-pay | Admitting: Urology

## 2022-06-18 ENCOUNTER — Encounter: Payer: Self-pay | Admitting: Pulmonary Disease

## 2022-07-29 ENCOUNTER — Ambulatory Visit: Payer: Medicare Other | Admitting: Adult Health

## 2022-08-14 ENCOUNTER — Ambulatory Visit: Payer: Medicare Other | Admitting: Podiatry

## 2022-10-29 ENCOUNTER — Other Ambulatory Visit: Payer: Self-pay | Admitting: Urology

## 2022-12-24 ENCOUNTER — Other Ambulatory Visit: Payer: Self-pay | Admitting: Urology

## 2023-01-22 ENCOUNTER — Other Ambulatory Visit: Payer: Self-pay | Admitting: Urology

## 2023-01-22 ENCOUNTER — Inpatient Hospital Stay
Admission: EM | Admit: 2023-01-22 | Discharge: 2023-04-03 | DRG: 918 | Payer: Medicare Other | Attending: Osteopathic Medicine | Admitting: Osteopathic Medicine

## 2023-01-22 ENCOUNTER — Other Ambulatory Visit: Payer: Self-pay

## 2023-01-22 DIAGNOSIS — T426X1A Poisoning by other antiepileptic and sedative-hypnotic drugs, accidental (unintentional), initial encounter: Secondary | ICD-10-CM | POA: Diagnosis not present

## 2023-01-22 DIAGNOSIS — R4689 Other symptoms and signs involving appearance and behavior: Principal | ICD-10-CM

## 2023-01-22 DIAGNOSIS — Z79899 Other long term (current) drug therapy: Secondary | ICD-10-CM

## 2023-01-22 DIAGNOSIS — Y92009 Unspecified place in unspecified non-institutional (private) residence as the place of occurrence of the external cause: Secondary | ICD-10-CM

## 2023-01-22 DIAGNOSIS — R2681 Unsteadiness on feet: Secondary | ICD-10-CM | POA: Diagnosis present

## 2023-01-22 DIAGNOSIS — E871 Hypo-osmolality and hyponatremia: Secondary | ICD-10-CM | POA: Diagnosis present

## 2023-01-22 DIAGNOSIS — R41843 Psychomotor deficit: Secondary | ICD-10-CM | POA: Diagnosis present

## 2023-01-22 DIAGNOSIS — I10 Essential (primary) hypertension: Secondary | ICD-10-CM | POA: Insufficient documentation

## 2023-01-22 DIAGNOSIS — F71 Moderate intellectual disabilities: Secondary | ICD-10-CM | POA: Diagnosis present

## 2023-01-22 DIAGNOSIS — T426X5A Adverse effect of other antiepileptic and sedative-hypnotic drugs, initial encounter: Secondary | ICD-10-CM | POA: Diagnosis present

## 2023-01-22 DIAGNOSIS — L03031 Cellulitis of right toe: Secondary | ICD-10-CM | POA: Insufficient documentation

## 2023-01-22 DIAGNOSIS — Z751 Person awaiting admission to adequate facility elsewhere: Secondary | ICD-10-CM

## 2023-01-22 DIAGNOSIS — F6381 Intermittent explosive disorder: Secondary | ICD-10-CM | POA: Diagnosis present

## 2023-01-22 DIAGNOSIS — R32 Unspecified urinary incontinence: Secondary | ICD-10-CM | POA: Diagnosis present

## 2023-01-22 DIAGNOSIS — R45850 Homicidal ideations: Secondary | ICD-10-CM | POA: Diagnosis present

## 2023-01-22 DIAGNOSIS — R509 Fever, unspecified: Secondary | ICD-10-CM | POA: Diagnosis not present

## 2023-01-22 DIAGNOSIS — F913 Oppositional defiant disorder: Secondary | ICD-10-CM | POA: Diagnosis present

## 2023-01-22 DIAGNOSIS — R4781 Slurred speech: Secondary | ICD-10-CM | POA: Diagnosis present

## 2023-01-22 DIAGNOSIS — F909 Attention-deficit hyperactivity disorder, unspecified type: Secondary | ICD-10-CM | POA: Diagnosis present

## 2023-01-22 DIAGNOSIS — R4182 Altered mental status, unspecified: Secondary | ICD-10-CM | POA: Diagnosis not present

## 2023-01-22 DIAGNOSIS — E7220 Disorder of urea cycle metabolism, unspecified: Secondary | ICD-10-CM | POA: Diagnosis present

## 2023-01-22 DIAGNOSIS — G4733 Obstructive sleep apnea (adult) (pediatric): Secondary | ICD-10-CM | POA: Diagnosis present

## 2023-01-22 LAB — URINE DRUG SCREEN, QUALITATIVE (ARMC ONLY)
Amphetamines, Ur Screen: NOT DETECTED
Barbiturates, Ur Screen: NOT DETECTED
Benzodiazepine, Ur Scrn: NOT DETECTED
Cannabinoid 50 Ng, Ur ~~LOC~~: NOT DETECTED
Cocaine Metabolite,Ur ~~LOC~~: NOT DETECTED
MDMA (Ecstasy)Ur Screen: NOT DETECTED
Methadone Scn, Ur: NOT DETECTED
Opiate, Ur Screen: NOT DETECTED
Phencyclidine (PCP) Ur S: NOT DETECTED
Tricyclic, Ur Screen: NOT DETECTED

## 2023-01-22 LAB — SALICYLATE LEVEL: Salicylate Lvl: <7 mg/dL — ABNORMAL LOW (ref 7.0–30.0)

## 2023-01-22 LAB — COMPREHENSIVE METABOLIC PANEL
ALT: 22 U/L (ref 0–44)
AST: 32 U/L (ref 15–41)
Albumin: 4.3 g/dL (ref 3.5–5.0)
Alkaline Phosphatase: 72 U/L (ref 38–126)
Anion gap: 14 (ref 5–15)
BUN: 10 mg/dL (ref 6–20)
CO2: 17 mmol/L — ABNORMAL LOW (ref 22–32)
Calcium: 9.5 mg/dL (ref 8.9–10.3)
Chloride: 103 mmol/L (ref 98–111)
Creatinine, Ser: 0.96 mg/dL (ref 0.61–1.24)
GFR, Estimated: >60 mL/min (ref >60)
Glucose, Bld: 156 mg/dL — ABNORMAL HIGH (ref 70–99)
Potassium: 3.6 mmol/L (ref 3.5–5.1)
Sodium: 134 mmol/L — ABNORMAL LOW (ref 135–145)
Total Bilirubin: 0.7 mg/dL (ref 0.3–1.2)
Total Protein: 7.1 g/dL (ref 6.5–8.1)

## 2023-01-22 LAB — ACETAMINOPHEN LEVEL: Acetaminophen (Tylenol), Serum: <10 ug/mL — ABNORMAL LOW (ref 10–30)

## 2023-01-22 LAB — CBC
HCT: 48.2 % (ref 39.0–52.0)
Hemoglobin: 16.1 g/dL (ref 13.0–17.0)
MCH: 31.3 pg (ref 26.0–34.0)
MCHC: 33.4 g/dL (ref 30.0–36.0)
MCV: 93.8 fL (ref 80.0–100.0)
Platelets: 222 10*3/uL (ref 150–400)
RBC: 5.14 MIL/uL (ref 4.22–5.81)
RDW: 12.7 % (ref 11.5–15.5)
WBC: 9.4 10*3/uL (ref 4.0–10.5)
nRBC: 0 % (ref 0.0–0.2)

## 2023-01-22 LAB — ETHANOL: Alcohol, Ethyl (B): <10 mg/dL (ref <10)
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
## 2023-01-22 NOTE — ED Notes (Signed)
[name]
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
## 2023-01-23 DIAGNOSIS — F6381 Intermittent explosive disorder: Secondary | ICD-10-CM

## 2023-01-23 LAB — LITHIUM LEVEL: Lithium Lvl: 0.47 mmol/L — ABNORMAL LOW (ref 0.60–1.20)

## 2023-01-23 LAB — VALPROIC ACID LEVEL: Valproic Acid Lvl: 59 ug/mL (ref 50.0–100.0)

## 2023-01-23 MED ORDER — LISINOPRIL 20 MG PO TABS
20.0000 mg | ORAL_TABLET | Freq: Every day | ORAL | Status: DC
  Administered 2023-01-23 – 2023-03-29 (×44): 20 mg via ORAL
  Filled 2023-01-23 (×2): qty 1
  Filled 2023-01-23 (×2): qty 2
  Filled 2023-01-23 (×6): qty 1
  Filled 2023-01-23: qty 2
  Filled 2023-01-23 (×3): qty 1
  Filled 2023-01-23 (×3): qty 2
  Filled 2023-01-23: qty 1
  Filled 2023-01-23 (×3): qty 2
  Filled 2023-01-23 (×2): qty 1
  Filled 2023-01-23 (×2): qty 2
  Filled 2023-01-23: qty 1
  Filled 2023-01-23 (×2): qty 2
  Filled 2023-01-23 (×3): qty 1
  Filled 2023-01-23 (×2): qty 2
  Filled 2023-01-23 (×2): qty 1
  Filled 2023-01-23: qty 2
  Filled 2023-01-23 (×6): qty 1
  Filled 2023-01-23: qty 2
  Filled 2023-01-23: qty 1
  Filled 2023-01-23: qty 2
  Filled 2023-01-23 (×3): qty 1
  Filled 2023-01-23 (×2): qty 2
  Filled 2023-01-23 (×2): qty 1
  Filled 2023-01-23: qty 4
  Filled 2023-01-23: qty 1
  Filled 2023-01-23: qty 2
  Filled 2023-01-23: qty 1

## 2023-01-23 MED ORDER — MIRABEGRON ER 25 MG PO TB24
25.0000 mg | ORAL_TABLET | Freq: Every day | ORAL | Status: DC
  Administered 2023-01-23 – 2023-04-03 (×70): 25 mg via ORAL
  Filled 2023-01-23 (×73): qty 1

## 2023-01-23 MED ORDER — CLONIDINE HCL 0.1 MG PO TABS: 0.1000 mg | ORAL_TABLET | Freq: Two times a day (BID) | ORAL | Status: DC

## 2023-01-23 MED ORDER — HALOPERIDOL 0.5 MG PO TABS
0.5000 mg | ORAL_TABLET | Freq: Every day | ORAL | Status: DC | PRN
  Administered 2023-01-23 – 2023-04-02 (×4): 0.5 mg via ORAL
  Filled 2023-01-23 (×4): qty 1

## 2023-01-23 MED ORDER — IPRATROPIUM BROMIDE 0.03 % NA SOLN
2.0000 | Freq: Two times a day (BID) | NASAL | Status: DC
  Administered 2023-01-23 – 2023-03-30 (×26): 2 via NASAL
  Filled 2023-01-23 (×2): qty 30

## 2023-01-23 MED ORDER — CLONIDINE HCL 0.1 MG PO TABS
0.1000 mg | ORAL_TABLET | Freq: Every day | ORAL | Status: DC
  Administered 2023-01-23 – 2023-04-03 (×47): 0.1 mg via ORAL
  Filled 2023-01-23 (×56): qty 1

## 2023-01-23 MED ORDER — LITHIUM CARBONATE 300 MG PO CAPS
300.0000 mg | ORAL_CAPSULE | Freq: Two times a day (BID) | ORAL | Status: DC
  Administered 2023-01-23 – 2023-04-03 (×139): 300 mg via ORAL
  Filled 2023-01-23 (×140): qty 1

## 2023-01-23 MED ORDER — ACETAMINOPHEN 325 MG PO TABS
650.0000 mg | ORAL_TABLET | Freq: Four times a day (QID) | ORAL | Status: DC | PRN
  Administered 2023-02-03 – 2023-03-16 (×8): 650 mg via ORAL
  Filled 2023-01-23 (×8): qty 2

## 2023-01-23 MED ORDER — DIVALPROEX SODIUM 500 MG PO DR TAB
500.0000 mg | DELAYED_RELEASE_TABLET | Freq: Every day | ORAL | Status: DC
  Administered 2023-01-23 – 2023-03-25 (×61): 500 mg via ORAL
  Filled 2023-01-23 (×62): qty 1

## 2023-01-23 MED ORDER — DULOXETINE HCL 30 MG PO CPEP
30.0000 mg | ORAL_CAPSULE | Freq: Two times a day (BID) | ORAL | Status: DC
  Administered 2023-01-23 – 2023-04-03 (×139): 30 mg via ORAL
  Filled 2023-01-23 (×143): qty 1

## 2023-01-23 MED ORDER — ACETAMINOPHEN 500 MG PO TABS
1000.0000 mg | ORAL_TABLET | Freq: Once | ORAL | Status: AC
  Administered 2023-01-23: 1000 mg via ORAL
  Filled 2023-01-23: qty 2

## 2023-01-23 MED ORDER — HYDROXYZINE HCL 50 MG PO TABS
50.0000 mg | ORAL_TABLET | Freq: Three times a day (TID) | ORAL | Status: DC | PRN
  Administered 2023-01-23 – 2023-04-03 (×7): 50 mg via ORAL
  Filled 2023-01-23: qty 2
  Filled 2023-01-23: qty 1
  Filled 2023-01-23 (×2): qty 2
  Filled 2023-01-23: qty 1
  Filled 2023-01-23: qty 2
  Filled 2023-01-23: qty 1

## 2023-01-23 MED ORDER — RISPERIDONE 1 MG PO TABS: 0.5000 mg | ORAL_TABLET | Freq: Two times a day (BID) | ORAL | Status: DC

## 2023-01-23 MED ORDER — CLONIDINE HCL 0.1 MG PO TABS
0.2000 mg | ORAL_TABLET | Freq: Every day | ORAL | Status: DC
  Administered 2023-01-23 – 2023-04-02 (×69): 0.2 mg via ORAL
  Filled 2023-01-23 (×69): qty 2

## 2023-01-23 MED ORDER — LITHIUM CARBONATE 150 MG PO CAPS
150.0000 mg | ORAL_CAPSULE | Freq: Every day | ORAL | Status: DC
  Filled 2023-01-23: qty 1

## 2023-01-23 MED ORDER — DIVALPROEX SODIUM ER 250 MG PO TB24: 500.0000 mg | ORAL_TABLET | Freq: Two times a day (BID) | ORAL | Status: DC

## 2023-01-23 MED ORDER — BENZTROPINE MESYLATE 1 MG PO TABS
1.0000 mg | ORAL_TABLET | Freq: Two times a day (BID) | ORAL | Status: DC
  Administered 2023-01-23 – 2023-04-03 (×141): 1 mg via ORAL
  Filled 2023-01-23 (×141): qty 1

## 2023-01-23 MED ORDER — PANTOPRAZOLE SODIUM 40 MG PO TBEC
40.0000 mg | DELAYED_RELEASE_TABLET | Freq: Every day | ORAL | Status: DC
  Administered 2023-01-23 – 2023-04-03 (×70): 40 mg via ORAL
  Filled 2023-01-23 (×70): qty 1

## 2023-01-23 MED ORDER — CLONAZEPAM 0.5 MG PO TABS
1.0000 mg | ORAL_TABLET | Freq: Two times a day (BID) | ORAL | Status: DC
  Administered 2023-01-23 – 2023-04-03 (×140): 1 mg via ORAL
  Filled 2023-01-23 (×2): qty 1
  Filled 2023-01-23: qty 2
  Filled 2023-01-23 (×2): qty 1
  Filled 2023-01-23 (×5): qty 2
  Filled 2023-01-23 (×2): qty 1
  Filled 2023-01-23: qty 2
  Filled 2023-01-23 (×2): qty 1
  Filled 2023-01-23 (×2): qty 2
  Filled 2023-01-23 (×2): qty 1
  Filled 2023-01-23: qty 2
  Filled 2023-01-23 (×2): qty 1
  Filled 2023-01-23 (×2): qty 2
  Filled 2023-01-23 (×3): qty 1
  Filled 2023-01-23 (×3): qty 2
  Filled 2023-01-23: qty 1
  Filled 2023-01-23: qty 2
  Filled 2023-01-23: qty 1
  Filled 2023-01-23 (×2): qty 2
  Filled 2023-01-23 (×2): qty 1
  Filled 2023-01-23 (×2): qty 2
  Filled 2023-01-23 (×8): qty 1
  Filled 2023-01-23: qty 2
  Filled 2023-01-23: qty 1
  Filled 2023-01-23: qty 2
  Filled 2023-01-23 (×5): qty 1
  Filled 2023-01-23 (×3): qty 2
  Filled 2023-01-23: qty 1
  Filled 2023-01-23 (×3): qty 2
  Filled 2023-01-23: qty 1
  Filled 2023-01-23: qty 2
  Filled 2023-01-23: qty 1
  Filled 2023-01-23: qty 2
  Filled 2023-01-23: qty 1
  Filled 2023-01-23 (×2): qty 2
  Filled 2023-01-23: qty 1
  Filled 2023-01-23 (×2): qty 2
  Filled 2023-01-23 (×2): qty 1
  Filled 2023-01-23: qty 2
  Filled 2023-01-23: qty 1
  Filled 2023-01-23 (×2): qty 2
  Filled 2023-01-23: qty 1
  Filled 2023-01-23: qty 2
  Filled 2023-01-23: qty 1
  Filled 2023-01-23 (×2): qty 2
  Filled 2023-01-23 (×3): qty 1
  Filled 2023-01-23: qty 2
  Filled 2023-01-23: qty 1
  Filled 2023-01-23 (×2): qty 2
  Filled 2023-01-23 (×2): qty 1
  Filled 2023-01-23: qty 2
  Filled 2023-01-23: qty 1
  Filled 2023-01-23: qty 2
  Filled 2023-01-23: qty 1
  Filled 2023-01-23: qty 2
  Filled 2023-01-23 (×2): qty 1
  Filled 2023-01-23 (×2): qty 2
  Filled 2023-01-23: qty 1
  Filled 2023-01-23: qty 2
  Filled 2023-01-23 (×4): qty 1
  Filled 2023-01-23: qty 2
  Filled 2023-01-23: qty 1
  Filled 2023-01-23: qty 2
  Filled 2023-01-23 (×2): qty 1
  Filled 2023-01-23 (×2): qty 2
  Filled 2023-01-23 (×2): qty 1
  Filled 2023-01-23: qty 2
  Filled 2023-01-23: qty 1
  Filled 2023-01-23: qty 2
  Filled 2023-01-23: qty 1
  Filled 2023-01-23 (×3): qty 2
  Filled 2023-01-23 (×3): qty 1
  Filled 2023-01-23: qty 2
  Filled 2023-01-23 (×3): qty 1
  Filled 2023-01-23 (×2): qty 2
  Filled 2023-01-23 (×4): qty 1
  Filled 2023-01-23: qty 2
  Filled 2023-01-23 (×3): qty 1

## 2023-01-23 MED ORDER — MELATONIN 5 MG PO TABS
10.0000 mg | ORAL_TABLET | Freq: Every day | ORAL | Status: DC
  Administered 2023-01-23 – 2023-04-02 (×69): 10 mg via ORAL
  Filled 2023-01-23 (×69): qty 2

## 2023-01-23 MED ORDER — DIVALPROEX SODIUM 500 MG PO DR TAB
750.0000 mg | DELAYED_RELEASE_TABLET | Freq: Every day | ORAL | Status: DC
  Administered 2023-01-23 – 2023-03-24 (×61): 750 mg via ORAL
  Filled 2023-01-23 (×61): qty 1

## 2023-01-23 MED ORDER — RISPERIDONE 1 MG PO TABS
1.0000 mg | ORAL_TABLET | Freq: Two times a day (BID) | ORAL | Status: DC
  Administered 2023-01-23 – 2023-02-16 (×50): 1 mg via ORAL
  Filled 2023-01-23 (×51): qty 1

## 2023-01-23 MED ORDER — AZELASTINE HCL 0.1 % NA SOLN
1.0000 | Freq: Two times a day (BID) | NASAL | Status: DC
  Administered 2023-01-23 – 2023-03-30 (×25): 1 via NASAL
  Filled 2023-01-23 (×2): qty 30

## 2023-01-23 MED ORDER — ALBUTEROL SULFATE HFA 108 (90 BASE) MCG/ACT IN AERS: 2.0000 | INHALATION_SPRAY | RESPIRATORY_TRACT | Status: DC | PRN

## 2023-01-23 MED ORDER — LURASIDONE HCL 40 MG PO TABS
60.0000 mg | ORAL_TABLET | Freq: Every day | ORAL | Status: DC
  Administered 2023-01-23 – 2023-03-27 (×63): 60 mg via ORAL
  Filled 2023-01-23: qty 3
  Filled 2023-01-23 (×10): qty 2
  Filled 2023-01-23: qty 3
  Filled 2023-01-23 (×5): qty 2
  Filled 2023-01-23: qty 3
  Filled 2023-01-23 (×4): qty 2
  Filled 2023-01-23: qty 3
  Filled 2023-01-23 (×11): qty 2
  Filled 2023-01-23: qty 3
  Filled 2023-01-23 (×9): qty 2
  Filled 2023-01-23: qty 3
  Filled 2023-01-23 (×3): qty 2
  Filled 2023-01-23 (×2): qty 3
  Filled 2023-01-23 (×3): qty 2
  Filled 2023-01-23: qty 3
  Filled 2023-01-23 (×5): qty 2
  Filled 2023-01-23: qty 3
  Filled 2023-01-23 (×3): qty 2
  Filled 2023-01-23: qty 3
  Filled 2023-01-23 (×12): qty 2

## 2023-01-23 MED ORDER — HALOPERIDOL 5 MG PO TABS
10.0000 mg | ORAL_TABLET | Freq: Every day | ORAL | Status: DC
  Administered 2023-01-23 – 2023-04-03 (×70): 10 mg via ORAL
  Filled 2023-01-23 (×70): qty 2

## 2023-01-23 NOTE — ED Notes (Signed)
VOL/Consult completed/ Pending TOC assistance

## 2023-01-23 NOTE — ED Provider Notes (Signed)
Emergency Medicine Observation Re-evaluation Note  Louis Edwards is a 37 y.o. male, seen on rounds today.  Pt initially presented to the ED for complaints of Mental Health Problem  Currently, the patient is is no acute distress. Denies any concerns at this time.  Physical Exam  Blood pressure (!) 124/96, pulse (!) 112, temperature 99.7 F (37.6 C), temperature source Oral, resp. rate 20, height  (1.651 m), weight 99.8 kg, SpO2 96 %.  Physical Exam: General: No apparent distress Pulm: Normal WOB Neuro: Moving all extremities Psych: Resting comfortably     ED Course / MDM     I have reviewed the labs performed to date as well as medications administered while in observation.  Recent changes in the last 24 hours include: No acute events overnight.  Plan   Current plan: Patient awaiting psychiatric disposition. Patient is not under full IVC at this time.    Bedie Dominey, Layla Maw, DO 01/23/23 [phone]
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
## 2023-01-23 NOTE — ED Notes (Signed)
Pt given sandwich tray and drink. 
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
## 2023-01-23 NOTE — BH Assessment (Signed)
Patient's AFL Caregiver Scotty Court) [phone])

## 2023-01-23 NOTE — ED Notes (Signed)
Hospital dinner tray and eco safe spoon set up and provided to pt.

## 2023-01-24 NOTE — ED Provider Notes (Addendum)
Emergency Medicine Observation Re-evaluation Note  Louis Edwards is a 37 y.o. male, seen on rounds today.  Pt initially presented to the ED for complaints of Mental Health Problem  Currently, the patient is is no acute distress. Denies any concerns at this time.  Physical Exam  Blood pressure (!) 88/57, pulse (!) 104, temperature 98.3 F (36.8 C), temperature source Oral, resp. rate 16, height  (1.651 m), weight 99.8 kg, SpO2 93 %.  Physical Exam: General: No apparent distress Pulm: Normal WOB Neuro: Moving all extremities Psych: Resting comfortably     ED Course / MDM     I have reviewed the labs performed to date as well as medications administered while in observation.  Recent changes in the last 24 hours include: No acute events overnight.  This morning blood pressure was low with standing in the doorway.  Will hold antihypertensive medications.  Will recheck later this afternoon.  Repeat blood pressure was normal.  Patient without any symptoms of an infectious process.  Plan   Current plan: Patient awaiting psychiatric disposition. Patient is not under full IVC at this time.    Corena Herter, MD 01/24/23 1048    Corena Herter, MD 01/24/23 1450

## 2023-01-24 NOTE — ED Notes (Signed)
VOL/pending TOC consult ?

## 2023-01-24 NOTE — ED Notes (Signed)
Upon entering room, pt sleeping. Easy arousable when his name was called, RN assisted to sit him up at edge of bed. VS taken by Idalia Needle, ED Tech. Pt is calm, requested a sandwich to eat. Provided pt with sandwich tray. Pt took all his scheduled night medications except his nasal sprays. Pt denies any other needs at present

## 2023-01-24 NOTE — ED Notes (Signed)
Pt sleeping, his right side

## 2023-01-24 NOTE — ED Notes (Signed)
Dinner tray provided for pt 

## 2023-01-25 NOTE — ED Notes (Signed)
Pt awake and vital signs obtained at this time. Pt also given nighttime snack.

## 2023-01-25 NOTE — ED Notes (Signed)
Unable to obtain vital signs due to pt being asleep. 

## 2023-01-25 NOTE — ED Provider Notes (Signed)
-----------------------------------------   6:34 AM on 01/25/2023 -----------------------------------------   Blood pressure 113/73, pulse 80, temperature 98 F (36.7 C), temperature source Oral, resp. rate 15, height  (1.651 m), weight 99.8 kg, SpO2 91 %.  The patient is calm and cooperative at this time.  There have been no acute events since the last update.  Awaiting disposition plan from case management/social work.    Aanya Haynes, Layla Maw, DO 01/25/23 [phone]
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
## 2023-01-26 MED ORDER — ALBUTEROL SULFATE (2.5 MG/3ML) 0.083% IN NEBU: 2.5000 mg | INHALATION_SOLUTION | RESPIRATORY_TRACT | Status: DC | PRN

## 2023-01-26 NOTE — Progress Notes (Signed)
CSW received a call from [place] with [facility], [phone] who stated that patients care coordinator is under [facility] counseling. CSW contacted Elfredia Nevins with [facility] who stated the mother opted out of care manager services. Barbara Cower stated that CSW would have to get that set back up with [facility]. CSW contact [place] back who stated she has tried to contact the mother and group home with no success. CSW contacted patients mother and told her to contact [place]. Ms. Welford Roche voiced understanding.

## 2023-01-26 NOTE — ED Notes (Signed)
Patient did received breakfast but did not eat anything.

## 2023-01-26 NOTE — ED Notes (Signed)
vol/toc placement.. 

## 2023-01-26 NOTE — ED Notes (Signed)
VOL  TOC  PLACEMENT 

## 2023-01-27 NOTE — ED Provider Notes (Signed)
-----------------------------------------   5:47 AM on 01/27/2023 -----------------------------------------   Blood pressure 118/78, pulse 78, temperature (!) 97.5 F (36.4 C), temperature source Oral, resp. rate 17, height  (1.651 m), weight 99.8 kg, SpO2 90 %.  The patient is calm and cooperative at this time.  There have been no acute events since the last update.  Awaiting disposition plan from Social Work team.   Irean Hong, MD 01/27/23 [phone]

## 2023-01-27 NOTE — ED Notes (Signed)
Pt given nighttime snack. 

## 2023-01-27 NOTE — ED Notes (Signed)
Patient given breakfast tray.

## 2023-01-27 NOTE — ED Notes (Signed)
VOL  TOC  PLACEMENT 

## 2023-01-27 NOTE — TOC Initial Note (Signed)
Transition of Care [facility]) - Initial/Assessment Note    Patient Details  Name: Louis Edwards MRN: [id number] Date of Birth: 1986-05-06  Transition of Care [facility]) CM/SW Contact:    Darolyn Rua, LCSW Phone Number: 01/27/2023, 2:41 PM  Clinical Narrative:                  CSW notes [facility] is from [facility] [phone]).  Patient is active with [facility] [employer], care manager is currently Derrell Lolling [phone]..  CSW spoke with Derrell Lolling who reports she is just recently received this case and is going to follow up on more information today.   CSW spoke with [facility] with group home he reports patient is able to come back however he will only receive 8 hours a day of care/supervision and he needs 24/7 supervision or else he assaults or attacks others. He reports it would be a hazard for group home to accept him back without 24/7 supervision. Patient was receiving the 24/7 care however [employer] stopped funding that piece per [facility]. Derrell Lolling with [employer] is looking into this as to why this funding was halted and any barriers to resuming funding for 24/7 supervision so patient can return to the group home.   Expected Discharge Plan: Group Home Barriers to Discharge:  (placement)   Patient Goals and CMS Choice   CMS Medicare.gov Compare Post Acute Care list provided to:: Patient Represenative (must comment) ([facility] and group home)        Expected Discharge Plan and Services       Living arrangements for the past 2 months: Group Home                                      Prior Living Arrangements/Services Living arrangements for the past 2 months: Group Home Lives with:: Facility Resident                   Activities of Daily Living      Permission Sought/Granted                  Emotional Assessment              Admission diagnosis:  Mental eval Patient Active Problem List   Diagnosis Date Noted   Pain due to  onychomycosis of toenails of both feet 04/10/2022   OSA on CPAP 10/28/2021   CPAP use counseling 10/28/2021   OSA (obstructive sleep apnea) 07/01/2021   Obesity (BMI 30-39.9) 07/01/2021   Hypertension 07/01/2021   Moderate intellectual disability    Intermittent explosive disorder 07/08/2020   PCP:  Anselm Jungling, NP Pharmacy:   [facility] - [place], [place] - [address] [place] [address] Phone: [phone] Fax: [phone]     Social Determinants of Health (SDOH) Social History: SDOH Screenings   Tobacco Use: Low Risk  (01/22/2023)   SDOH Interventions:     Readmission Risk Interventions     No data to display

## 2023-01-27 NOTE — ED Notes (Signed)
This NT walked with patient to the restroom down the hallway. Patient was cooperative and returned to room after.

## 2023-01-28 NOTE — ED Notes (Signed)
Pt given nighttime snack. 

## 2023-01-28 NOTE — ED Notes (Signed)
Pt awake at this time and ate lunch tray. Waste discarded appropriately

## 2023-01-28 NOTE — ED Notes (Signed)
Pt breakfast at bedside. Pt is asleep at this time. 

## 2023-01-28 NOTE — ED Provider Notes (Signed)
-----------------------------------------   8:19 AM on 01/28/2023 -----------------------------------------   Blood pressure 107/63, pulse 83, temperature 97.7 F (36.5 C), temperature source Oral, resp. rate 18, height 1.651 m ( ), weight 99.8 kg, SpO2 93 %.  The patient is calm and cooperative at this time.  There have been no acute events since the last update.  Awaiting disposition plan from [facility] team.   Loleta Rose, MD 01/28/23 [phone]

## 2023-01-28 NOTE — ED Notes (Signed)
vol/toc placement.. 

## 2023-01-28 NOTE — ED Notes (Signed)
Pt lunch tray at bedside. Pt is asleep at this time

## 2023-01-28 NOTE — TOC Progression Note (Signed)
Transition of Care [facility]) - Progression Note    Patient Details  Name: Louis Edwards MRN: [id number] Date of Birth: Jun 22, 1986  Transition of Care [facility]) CM/SW Contact  Margarito Liner, LCSW Phone Number: 01/28/2023, 12:06 PM  Clinical Narrative: Sherron Monday with Derrell Lolling with [employer]. She stated group home director wants additional funding for 24/7 care. She is going to email him the documentation to submit for request for additional  funding and he is going to try to complete and send in today. Derrell Lolling has tried contacting his legal guardian but has not received a call back yet.  Expected Discharge Plan: Group Home Barriers to Discharge:  (placement)  Expected Discharge Plan and Services       Living arrangements for the past 2 months: Group Home                                       Social Determinants of Health (SDOH) Interventions SDOH Screenings   Tobacco Use: Low Risk  (01/22/2023)    Readmission Risk Interventions     No data to display

## 2023-01-28 NOTE — ED Notes (Incomplete)
Pt breakfast at bedside. Pt

## 2023-01-29 NOTE — ED Notes (Signed)
Snack and drink was given 

## 2023-01-29 NOTE — ED Notes (Signed)
Pt given dinner tray and drink; pt currently sleeping.

## 2023-01-29 NOTE — ED Notes (Signed)
Pt requested soda. Explained pt can have soda with dinner. Offered pt cup of water; pt declined. Pt c/c.

## 2023-01-29 NOTE — ED Provider Notes (Signed)
Emergency Medicine Observation Re-evaluation Note  Physical Exam   BP 102/62 (BP Location: Left Arm)   Pulse 82   Temp (!) 97.5 F (36.4 C) (Oral)   Resp 17   Ht  (1.651 m)   Wt 99.8 kg   SpO2 94%   BMI 36.61 kg/m   Patient appears in no acute distress.  ED Course / MDM   No reported events during my shift at the time of this note.   Pt is awaiting dispo from SW   Pilar Jarvis MD    Pilar Jarvis, MD 01/29/23 0001

## 2023-01-29 NOTE — ED Notes (Signed)
Will provide pt with scheduled morning meds once breakfast trays arrive and pt is awake.

## 2023-01-29 NOTE — ED Notes (Signed)
Pt walked outside his room and handed Faith NT his trash from breakfast; pt steady; pt continues to mumble during conversation. Pt c/c.

## 2023-01-29 NOTE — ED Notes (Signed)
VOL/Pending TOC Placement 

## 2023-01-29 NOTE — ED Notes (Signed)
Pt received breakfast tray and drink. Pt c/c.  

## 2023-01-29 NOTE — ED Notes (Signed)
Pt denied shower. RN notified.

## 2023-01-30 NOTE — ED Notes (Signed)
Pt resting, will give PO medications once patient wakes up.

## 2023-01-30 NOTE — ED Provider Notes (Signed)
-----------------------------------------   2:43 AM on 01/30/2023 -----------------------------------------   Blood pressure 113/89, pulse 66, temperature 98.3 F (36.8 C), temperature source Oral, resp. rate 17, height 1.651 m (5\' 5" ), weight 99.8 kg, SpO2 98 %.  The patient is calm and cooperative at this time.  There have been no acute events since the last update.  Awaiting disposition plan from [facility] team.   Loleta Rose, MD 01/30/23 [phone]

## 2023-01-31 NOTE — ED Notes (Signed)
Meal tray placed at bedside. Pt sleeping.

## 2023-01-31 NOTE — ED Provider Notes (Signed)
-----------------------------------------   7:04 AM on 01/31/2023 -----------------------------------------   Blood pressure 123/74, pulse 78, temperature 98.3 F (36.8 C), temperature source Oral, resp. rate 20, height 5\' 5"  (1.651 m), weight 99.8 kg, SpO2 96 %.  The patient is calm and cooperative at this time.  There have been no acute events since the last update.  Awaiting disposition plan from Social Work team.   Irean Hong, MD 01/31/23 [phone]

## 2023-01-31 NOTE — ED Notes (Signed)
VOL/pending TOC placement 

## 2023-01-31 NOTE — ED Notes (Signed)
Hospital snack tray and diet sprite in a foam cup brought to pt. All plastic removed prior to pt receiving snack. Pt very excited to receive snack. Pt heard in room squealing and thanked EDT multiple times.

## 2023-02-01 NOTE — ED Notes (Signed)
Hospital dinner tray and soda in a foam cup provided to pt. Pt awakened by this EDT and encourage to sit up to facilitate eating. All plastic removed from tray prior to pt receiving tray and an eco safe spoon given along with tray. Pt nodded his head and laid back down. Pts drinks set on floor to prevent spilling.

## 2023-02-01 NOTE — ED Notes (Signed)
VOL/Pending TOC Placement 

## 2023-02-01 NOTE — ED Notes (Signed)
Pt seen standing in his doorway looking out at nurses station. EDT asked pt if he needed anything. Pt denied needing anything at this time and stated he was "just watching people"

## 2023-02-01 NOTE — ED Notes (Signed)
Meal fully consumed and trash disposed of properly

## 2023-02-01 NOTE — ED Notes (Signed)
Pt now awake and alert. Pt took 1700 medications and tolerated well. Pt currently eating dinner.

## 2023-02-01 NOTE — ED Notes (Signed)
Patient given phone #

## 2023-02-01 NOTE — ED Notes (Signed)
Snack and drink given 

## 2023-02-01 NOTE — ED Notes (Signed)
Hospital meal provided, pt tolerated w/o complaints.  Waste discarded appropriately.  

## 2023-02-01 NOTE — ED Provider Notes (Signed)
7:44 PM  Blood pressure 114/78, pulse 78, temperature 98.3 F (36.8 C), temperature source Oral, resp. rate 17, height 5\' 5"  (1.651 m), weight 99.8 kg, SpO2 99 %.  The patient is calm and cooperative at this time.  There have been no acute events since the last update.  Awaiting disposition plan from Social Work team.     Concha Se, MD 02/01/23 1944

## 2023-02-01 NOTE — ED Notes (Signed)
Patient asking staff why patient across hall is yelling at him.  Reassured patient that he was not being yelled out and that noise would quiet down once door to room was shut.

## 2023-02-01 NOTE — ED Notes (Signed)
Lunch tray and drink provided to pt at this time.  

## 2023-02-02 NOTE — ED Notes (Signed)
Hospital meal provided, pt tolerated w/o complaints.  Waste discarded appropriately.  

## 2023-02-02 NOTE — TOC Progression Note (Signed)
Transition of Care [facility]) - Progression Note    Patient Details  Name: Louis Edwards MRN: [id number] Date of Birth: Jan 31, 1986  Transition [facility]) CM/SW Contact  Halford Chessman Phone Number: 01/30/2023 6:23 PM  Clinical Narrative:     Per [facility], if funding can be increased group home willing to accept patient back.  TOC continuing to follow patient's progress throughout discharge planning.  Expected Discharge Plan: Group Home Barriers to Discharge:  (placement)  Expected Discharge Plan and Services       Living arrangements for the past 2 months: Group Home                                       Social Determinants of Health (SDOH) Interventions SDOH Screenings   Tobacco Use: Low Risk  (01/22/2023)    Readmission Risk Interventions     No data to display

## 2023-02-02 NOTE — ED Notes (Signed)
VOL  TOC  PLACEMENT 

## 2023-02-02 NOTE — ED Provider Notes (Signed)
-----------------------------------------   6:27 AM on 02/02/2023 -----------------------------------------   Blood pressure 114/69, pulse 100, temperature 98.4 F (36.9 C), temperature source Oral, resp. rate 18, height 5\' 5"  (1.651 m), weight 99.8 kg, SpO2 99 %.  The patient is calm and cooperative at this time.  There have been no acute events since the last update.  Awaiting disposition plan from Social Work team.   Irean Hong, MD 02/02/23 [phone]

## 2023-02-02 NOTE — ED Notes (Signed)
This tech obtained vitals on pt.  

## 2023-02-02 NOTE — TOC Progression Note (Signed)
Transition of Care [facility]) - Progression Note    Patient Details  Name: Louis Edwards MRN: [id number] Date of Birth: 1986-10-03  [facility]) CM/SW Contact  Darleene Cleaver, [place] Phone Number: 02/02/2023, 6:22 PM  Clinical Narrative:     Per [facility] they are still trying to get approval for increased in funding for patient to return to the group home.  TOC continuing to follow patient's progress throughout discharge planning.   Expected Discharge Plan: Group Home Barriers to Discharge:  (placement)  Expected Discharge Plan and Services       Living arrangements for the past 2 months: Group Home                                       Social Determinants of Health (SDOH) Interventions SDOH Screenings   Tobacco Use: Low Risk  (01/22/2023)    Readmission Risk Interventions     No data to display

## 2023-02-02 NOTE — ED Notes (Signed)
Visitor at bedside.

## 2023-02-03 NOTE — ED Notes (Signed)
vol/toc.... 

## 2023-02-03 NOTE — ED Notes (Signed)
Pt c/o bilateral foot pain at top of his feet and ankles; ankles and feet visually WDL; appropriate in color, dry, no swelling noted, 1 small bruise noted. Pt requesting imaging of his feet and ankles per security. EDP Roxan Hockey notified. Pt has been steady walking today and denies known mechanism of injury.

## 2023-02-03 NOTE — ED Notes (Signed)
Dinner tray provided for pt 

## 2023-02-03 NOTE — ED Notes (Signed)
Pt to receive next dose of lithium when he wakes.

## 2023-02-03 NOTE — ED Notes (Signed)
Pt's breakfast tray and drink placed at bedside as he remains asleep. Chest rise and fall noted.

## 2023-02-03 NOTE — ED Notes (Addendum)
Edit: note made on wrong pt's chart.

## 2023-02-03 NOTE — ED Notes (Signed)
Caregiver visited pt briefly. Security and NT aware. Pt satisfaction staff member at bedside during visit.

## 2023-02-03 NOTE — ED Notes (Signed)
Pt given PM snack by this NT. 

## 2023-02-03 NOTE — ED Notes (Signed)
VOL/Still Pending Placement 

## 2023-02-03 NOTE — ED Notes (Signed)
Pt received lunch tray and drink. Pt alert. 

## 2023-02-03 NOTE — ED Notes (Signed)
Pt given warm blanket.

## 2023-02-04 NOTE — ED Notes (Signed)
Report given to Amy, RN.

## 2023-02-04 NOTE — ED Notes (Addendum)
Mother came to visit with pt and he says visit went well

## 2023-02-04 NOTE — ED Notes (Signed)
Pt's BP 99/60 map 74, he has lisinopril 20mg  and clonidine 0.1mg  this a.m. EDP Dr Sidney Ace notified and order received to hold both meds.

## 2023-02-04 NOTE — ED Notes (Signed)
Dinner tray provided and waste discarded appropriately

## 2023-02-04 NOTE — ED Notes (Signed)
VOL TOC placement 

## 2023-02-04 NOTE — ED Notes (Signed)
Report off to alex rn

## 2023-02-04 NOTE — TOC Progression Note (Signed)
Transition of Care [facility]) - Progression Note    Patient Details  Name: Louis Edwards MRN: [id number] Date of Birth: 1985-11-28  Transition of Care [facility]) CM/SW Contact  Darolyn Rua, [place] Phone Number: 02/04/2023, 8:23 AM  Clinical Narrative:     [facility] called from [phone] they report no updates in funding for patient's care at group home, they submitted the request 4/25 pending approval or denial.    Expected Discharge Plan: Group Home Barriers to Discharge:  (placement)  Expected Discharge Plan and Services       Living arrangements for the past 2 months: Group Home                                       Social Determinants of Health (SDOH) Interventions SDOH Screenings   Tobacco Use: Low Risk  (01/22/2023)    Readmission Risk Interventions     No data to display

## 2023-02-04 NOTE — ED Notes (Signed)
Pt denies SI/HI/AVH on assessment 

## 2023-02-05 NOTE — ED Notes (Signed)
Dinner tray given at this time.  

## 2023-02-05 NOTE — ED Notes (Signed)
Snack placed at pt bedside. 

## 2023-02-05 NOTE — ED Notes (Signed)
vol/toc.... 

## 2023-02-05 NOTE — ED Notes (Signed)
Pt is asleep. Pt breakfast at bedside.

## 2023-02-05 NOTE — ED Notes (Signed)
VOL/TOC Placement Pending  

## 2023-02-05 NOTE — ED Provider Notes (Signed)
-----------------------------------------   7:54 AM on 02/05/2023 -----------------------------------------   Blood pressure 131/89, pulse 77, temperature 98 F (36.7 C), temperature source Oral, resp. rate 16, height 1.651 m (5\' 5" ), weight 99.8 kg, SpO2 97 %.  The patient is calm and cooperative at this time.  There have been no acute events since the last update.  Awaiting disposition plan from [facility] team.   Loleta Rose, MD 02/05/23 [phone]

## 2023-02-05 NOTE — ED Notes (Signed)
Pt taking shower. Pt was given hygiene items and the following, 1 clean top, 1 clean bottom, with 1 pair of disposable underwear.  Pt changed out into clean clothing.  Staff disposed of all shower supplies.   

## 2023-02-06 NOTE — TOC Progression Note (Signed)
Transition of Care [facility]) - Progression Note    Patient Details  Name: Louis Edwards MRN: [id number] Date of Birth: 05-06-1986  Transition of Care [facility]) CM/SW Contact  Darleene Cleaver, [place] Phone Number: 02/06/2023, 7:14 PM  Clinical Narrative:     Per [facility] still waiting on funding approval for patient to return to group home.  TOC continuing to follow patient's progress throughout discharge planning.    Expected Discharge Plan: Group Home Barriers to Discharge:  (placement)  Expected Discharge Plan and Services       Living arrangements for the past 2 months: Group Home                                       Social Determinants of Health (SDOH) Interventions SDOH Screenings   Tobacco Use: Low Risk  (01/22/2023)    Readmission Risk Interventions     No data to display

## 2023-02-06 NOTE — ED Notes (Signed)
Hospital meal provided, pt tolerated w/o complaints.  Waste discarded appropriately.  

## 2023-02-06 NOTE — ED Notes (Signed)
pt recieved snack and drink 

## 2023-02-06 NOTE — ED Provider Notes (Signed)
Emergency Medicine Observation Re-evaluation Note  Physical Exam   BP 115/75 (BP Location: Right Arm)   Pulse 89   Temp 98.2 F (36.8 C) (Oral)   Resp 17   Ht 5\' 5"  (1.651 m)   Wt 99.8 kg   SpO2 91%   BMI 36.61 kg/m   Patient appears in no acute distress.  ED Course / MDM   No reported events during my shift at the time of this note.   Pt is awaiting dispo from SW   Pilar Jarvis MD    Pilar Jarvis, MD 02/06/23 [phone]

## 2023-02-07 MED ORDER — ONDANSETRON 4 MG PO TBDP: 4.0000 mg | ORAL_TABLET | Freq: Once | ORAL | Status: DC

## 2023-02-07 NOTE — ED Notes (Signed)
TOC placement 

## 2023-02-07 NOTE — ED Notes (Signed)
Patient is vol pending TOC placement 

## 2023-02-07 NOTE — ED Notes (Signed)
VS ASSESS. BREAKFAST TRAY GIVEN.

## 2023-02-07 NOTE — ED Provider Notes (Signed)
-----------------------------------------   5:44 AM on 02/07/2023 -----------------------------------------   Blood pressure 118/80, pulse 84, temperature 98.1 F (36.7 C), temperature source Oral, resp. rate 18, height 5\' 5"  (1.651 m), weight 99.8 kg, SpO2 95 %.  The patient is calm and cooperative at this time.  There have been no acute events since the last update.  Awaiting disposition plan from Social Work team.   Irean Hong, MD 02/07/23 [phone]

## 2023-02-07 NOTE — ED Notes (Signed)
Pt attempted to put hands on this tech. After pt asked this tech, "After she leaves can I have her room?" This tech informed pt that he already has a room just like the pt across him and most likely someone else will be in this room. Pt was easy to verbally redirect to step back and go back to his room.

## 2023-02-07 NOTE — ED Notes (Signed)
Pt up to use the bathroom

## 2023-02-07 NOTE — ED Notes (Signed)
Pt back in room.

## 2023-02-08 NOTE — ED Notes (Signed)
Pt continues to sleep while this tech obtains VS.

## 2023-02-08 NOTE — ED Notes (Signed)
Hospital meal provided, pt tolerated w/o complaints.  Waste discarded appropriately.  

## 2023-02-08 NOTE — ED Provider Notes (Signed)
Emergency Medicine Observation Re-evaluation Note  Physical Exam   BP 132/78 (BP Location: Right Arm)   Pulse 88   Temp 98 F (36.7 C) (Oral)   Resp 18   Ht 5\' 5"  (1.651 m)   Wt 99.8 kg   SpO2 98%   BMI 36.61 kg/m   Patient appears in no acute distress.  ED Course / MDM   No reported events during my shift at the time of this note.   Pt is awaiting dispo from SW   Pilar Jarvis MD    Pilar Jarvis, MD 02/08/23 [phone]

## 2023-02-08 NOTE — ED Notes (Signed)
Pts breakfast tray at bedside; pt currently sleeping.

## 2023-02-08 NOTE — ED Notes (Signed)
Pt briefly using hospital phone.

## 2023-02-08 NOTE — ED Notes (Signed)
Hospital snack tray and diet soda in a foam cup provided to pt. Pt encouraged to sit up and eat. Pt expressed much excitement after being made aware his snack was ready. Pt thanked EDT multiple times

## 2023-02-08 NOTE — ED Notes (Signed)
Patient received dinner tray 

## 2023-02-09 NOTE — ED Notes (Signed)
Hospital meal provided, pt tolerated w/o complaints.  Waste discarded appropriately.  

## 2023-02-09 NOTE — TOC Progression Note (Deleted)
Transition of Care [facility]) - Progression Note    Patient Details  Name: SERENITY RAPA MRN: [id number] Date of Birth: 1986/04/05  Transition of Care [facility]) CM/SW Contact  Darleene Cleaver, [place] Phone Number: 02/09/2023, 5:41 PM  Clinical Narrative:     (After 2:30pm [phone])  Expected Discharge Plan: Group Home Barriers to Discharge:  (placement)  Expected Discharge Plan and Services       Living arrangements for the past 2 months: Group Home                                       Social Determinants of Health (SDOH) Interventions SDOH Screenings   Tobacco Use: Low Risk  (01/22/2023)    Readmission Risk Interventions     No data to display

## 2023-02-09 NOTE — ED Notes (Signed)
Pt given PM snack.  

## 2023-02-09 NOTE — ED Provider Notes (Signed)
-----------------------------------------   5:32 AM on 02/09/2023 -----------------------------------------   Blood pressure 127/83, pulse 86, temperature 98 F (36.7 C), temperature source Oral, resp. rate 16, height 5\' 5"  (1.651 m), weight 99.8 kg, SpO2 98 %.  The patient is calm and cooperative at this time.  There have been no acute events since the last update.  Awaiting disposition plan from Social Work team.   Irean Hong, MD 02/09/23 [phone]

## 2023-02-09 NOTE — TOC Progression Note (Addendum)
Transition of Care [facility]) - Progression Note    Patient Details  Name: Louis Edwards MRN: [id number] Date of Birth: 1986/04/12  Transition [facility]) CM/SW Contact  Darleene Cleaver, [place] Phone Number: 02/09/2023, 5:41 PM  Clinical Narrative:     Still waiting for increase in funding through [facility] [employer] for new group home placement.  Expected Discharge Plan: Group Home Barriers to Discharge:  (placement)  Expected Discharge Plan and Services       Living arrangements for the past 2 months: Group Home                                       Social Determinants of Health (SDOH) Interventions SDOH Screenings   Tobacco Use: Low Risk  (01/22/2023)    Readmission Risk Interventions     No data to display

## 2023-02-09 NOTE — ED Notes (Signed)
Breakfast tray and milk provided in pt room.

## 2023-02-09 NOTE — Progress Notes (Signed)
Chap responded to page. Pt in the room reading religious materials. Pt read the bible to the [place] and then requested a prayer. This Mirna Mires offered an intercessory prayer on behalf of the Pt as requested.   02/09/23 1300  Spiritual Encounters  Type of Visit Initial  Care provided to: Patient  Conversation partners present during encounter Nurse  Referral source Nurse (RN/NT/LPN)  Reason for visit Routine spiritual support  OnCall Visit No  Spiritual Framework  Presenting Themes Goals in life/care;Courage hope and growth  Interventions  Spiritual Care Interventions Made Compassionate presence;Mindfulness intervention  Intervention Outcomes  Outcomes Reduced anxiety;Reduced isolation  Spiritual Care Plan  Spiritual Care Issues Still Outstanding No further spiritual care needs at this time (see row info)

## 2023-02-09 NOTE — ED Notes (Signed)
Pt given shower supplies per his request and water turned on in pt bathroom.

## 2023-02-10 NOTE — ED Notes (Signed)
Patient given lunch tray.

## 2023-02-10 NOTE — ED Notes (Signed)
This tech obtained vital signs on pt.  

## 2023-02-10 NOTE — ED Notes (Signed)
Patient given dinner tray.

## 2023-02-10 NOTE — ED Notes (Signed)
VOLUNTARY/pending TOC placement 

## 2023-02-11 NOTE — ED Provider Notes (Signed)
Emergency Medicine Observation Re-evaluation Note  Physical Exam   BP (!) 109/50   Pulse 89   Temp 97.9 F (36.6 C) (Oral)   Resp 20   Ht 5\' 5"  (1.651 m)   Wt 99.8 kg   SpO2 94%   BMI 36.61 kg/m   Patient appears in no acute distress.  ED Course / MDM   No reported events during my shift at the time of this note.   Pt is awaiting dispo from SW   Pilar Jarvis MD    Pilar Jarvis, MD 02/11/23 [phone]

## 2023-02-11 NOTE — ED Provider Notes (Signed)
-----------------------------------------   6:12 AM on 02/11/2023 -----------------------------------------   Blood pressure (!) 109/50, pulse 89, temperature 97.9 F (36.6 C), temperature source Oral, resp. rate 20, height 5\' 5"  (1.651 m), weight 99.8 kg, SpO2 94 %.  The patient is calm and cooperative at this time.  There have been no acute events since the last update.  Awaiting disposition plan from Social Work team.   Irean Hong, MD 02/11/23 [phone]

## 2023-02-11 NOTE — ED Notes (Signed)
vol/toc.... 

## 2023-02-11 NOTE — ED Notes (Signed)
Pt given dinner tray and drink at this time.

## 2023-02-12 NOTE — ED Notes (Signed)
Chris, RN aware of vs 

## 2023-02-12 NOTE — ED Notes (Signed)
Pt was awoken by this RN to notify them that they had a visitor. Pt stated they would wake up so they can visit.

## 2023-02-12 NOTE — ED Notes (Signed)
Hospital meal provided, pt tolerated w/o complaints.  Waste discarded appropriately.  

## 2023-02-12 NOTE — ED Notes (Signed)
Snacks provided. Turkey sandwich tray and sprite given.  

## 2023-02-12 NOTE — ED Notes (Signed)
VOL/Still Pending TOC Placement 

## 2023-02-12 NOTE — TOC Progression Note (Signed)
Transition of Care [facility]) - Progression Note    Patient Details  Name: Louis Edwards MRN: [id number] Date of Birth: 01-Mar-1986  Transition of Care [facility]) CM/SW Contact  Darleene Cleaver, [place] Phone Number: 02/12/2023, 10:16 AM  Clinical Narrative:     CSW spoke to Horton Chin LTSS care manager, [phone], per Nelma Rothman, there was a funding increase requested by [employer] on April 25th.  The request is still pending.  Per Nelma Rothman, patient's 60 day discharge notification ends on May 31st.  [facility] has been looking at alternative options for placement and have not had any success yet.  Nelma Rothman is requesting updated clinical notes regarding patient, clinicals emailed to [email or web address].  TOC continuing to follow patient throughout discharge planning.     Expected Discharge Plan: Group Home Barriers to Discharge:  (placement)  Expected Discharge Plan and Services       Living arrangements for the past 2 months: Group Home                                       Social Determinants of Health (SDOH) Interventions SDOH Screenings   Tobacco Use: Low Risk  (01/22/2023)    Readmission Risk Interventions     No data to display

## 2023-02-13 NOTE — ED Notes (Signed)
Pt AOX4, NAD noted. Pt pleasant and cooperative. TV programmed for pt. Pt given drink per request.

## 2023-02-13 NOTE — ED Notes (Signed)
Pt given dinner tray and beverage  

## 2023-02-13 NOTE — ED Notes (Signed)
Patient given lunch tray.

## 2023-02-13 NOTE — ED Notes (Signed)
Pt on phone call at this time

## 2023-02-13 NOTE — ED Provider Notes (Signed)
Emergency Medicine Observation Re-evaluation Note  ADEBAYO HOLTER is a 37 y.o. male, seen on rounds today.  Pt initially presented to the ED for complaints of Mental Health Problem Currently, the patient is resting comfortably.  Physical Exam  BP 99/62   Pulse 73   Temp 97.7 F (36.5 C) (Oral)   Resp 18   Ht 5\' 5"  (1.651 m)   Wt 99.8 kg   SpO2 98%   BMI 36.61 kg/m  Physical Exam General: No acute distress Cardiac: Well-perfused extremities Lungs: No respiratory distress Psych: Appropriate mood and affect  ED Course / MDM  EKG:   I have reviewed the labs performed to date as well as medications administered while in observation.  Recent changes in the last 24 hours include none.  Plan  Current plan is for placement.    Merwyn Katos, MD 02/13/23 [phone]

## 2023-02-13 NOTE — ED Notes (Signed)
Breakfast tray given. °

## 2023-02-14 NOTE — ED Notes (Signed)
Snack in foam cups with eco safe spoons provided to pt along with fresh water in a foam cup. Pt informed no other snacks will be given until breakfast time. Pt informed to go back to his room to eat his snack. Snack fully consumed and trash disposed of properly.

## 2023-02-14 NOTE — ED Provider Notes (Signed)
Emergency Medicine Observation Re-evaluation Note  Louis Edwards is a 37 y.o. male, seen on rounds today.  Pt initially presented to the ED for complaints of Mental Health Problem Currently, the patient is sleeping comfortably, no issues overnight.  Physical Exam  BP 123/74 (BP Location: Left Arm)   Pulse 70   Temp 97.8 F (36.6 C) (Oral)   Resp 18   Ht 5\' 5"  (1.651 m)   Wt 99.8 kg   SpO2 96%   BMI 36.61 kg/m  Physical Exam Constitutional: Resting comfortably. Eyes: Conjunctivae are normal. Head: Atraumatic. Nose: No congestion/rhinnorhea. Mouth/Throat: Mucous membranes are moist. Neck: Normal ROM Cardiovascular: No cyanosis noted. Respiratory: Normal respiratory effort. Gastrointestinal: Non-distended. Genitourinary: deferred Musculoskeletal: No lower extremity tenderness nor edema. Neurologic:  Normal speech and language. No gross focal neurologic deficits are appreciated. Skin:  Skin is warm, dry and intact. No rash noted.   ED Course / MDM  EKG:   I have reviewed the labs performed to date as well as medications administered while in observation.  Recent changes in the last 24 hours include none.  Plan  Current plan is for dispo per social work.    Chesley Noon, MD 02/14/23 [phone]

## 2023-02-14 NOTE — ED Notes (Signed)
Hospital meal provided, pt tolerated w/o complaints.  Waste discarded appropriately.  

## 2023-02-15 NOTE — ED Notes (Signed)
Vol  pending  TOC  placement 

## 2023-02-15 NOTE — ED Notes (Signed)
Patient is vol pending TOC placement 

## 2023-02-15 NOTE — ED Notes (Signed)
Pt sleeping, will obtain vital signs with pt wakes up.

## 2023-02-15 NOTE — ED Notes (Signed)
Lunch tray given. 

## 2023-02-15 NOTE — ED Notes (Signed)
Hospital meal provided, pt tolerated w/o complaints.  Waste discarded appropriately.  

## 2023-02-16 ENCOUNTER — Other Ambulatory Visit: Payer: Self-pay | Admitting: Urology

## 2023-02-16 LAB — LITHIUM LEVEL: Lithium Lvl: 0.71 mmol/L (ref 0.60–1.20)

## 2023-02-16 NOTE — ED Notes (Signed)
Hospital meal provided, pt tolerated w/o complaints.  Waste discarded appropriately.  

## 2023-02-16 NOTE — ED Notes (Signed)
vol/toc.... 

## 2023-02-16 NOTE — NC FL2 (Signed)
[place] MEDICAID FL2 LEVEL OF CARE FORM     IDENTIFICATION  Patient Name: Louis Edwards Birthdate: 1986/06/09 Sex: male Admission Date (Current Location): 01/22/2023  [place] and [place] Number:  [address] [id number] [facility] Facility and Address:  [facility], [address] [place], [place] [address]      Provider Number: [id number]  Attending Physician Name and Address:  No att. providers found  Relative Name and Phone Number:  Felipa Furnace Mother   [phone]  Rolin Barry   [phone]  boyd,mr Other  [phone]    Current Level of Care: Hospital Recommended Level of Care: Family Care Home Prior Approval Number:    Date Approved/Denied:   PASRR Number:    Discharge Plan: Domiciliary (Rest home)    Current Diagnoses: Patient Active Problem List   Diagnosis Date Noted   Pain due to onychomycosis of toenails of both feet 04/10/2022   OSA on CPAP 10/28/2021   CPAP use counseling 10/28/2021   OSA (obstructive sleep apnea) 07/01/2021   Obesity (BMI 30-39.9) 07/01/2021   Hypertension 07/01/2021   Moderate intellectual disability    Intermittent explosive disorder 07/08/2020    Orientation RESPIRATION BLADDER Height & Weight     Self, Time, Situation, Place  Normal Continent Weight: 220 lb (99.8 kg) Height:  5\' 5"  (165.1 cm)  BEHAVIORAL SYMPTOMS/MOOD NEUROLOGICAL BOWEL NUTRITION STATUS      Continent Diet  AMBULATORY STATUS COMMUNICATION OF NEEDS Skin   Independent Verbally Normal                       Personal Care Assistance Level of Assistance  Bathing, Feeding, Dressing Bathing Assistance: Independent Feeding assistance: Independent Dressing Assistance: Independent     Functional Limitations Info  Sight, Speech, Hearing Sight Info: Adequate Hearing Info: Adequate Speech Info: Adequate    SPECIAL CARE FACTORS FREQUENCY                       Contractures Contractures Info: Not present    Additional  Factors Info  Code Status, Allergies, Psychotropic Code Status Info: Full Allergies Info: Keflex (Cephalexin)  Thorazine (Chlorpromazine) Psychotropic Info: risperiDONE (RISPERDAL) tablet 1 mg, haloperidol (HALDOL) tablet 10 mg, divalproex (DEPAKOTE) DR tablet 750 mg, divalproex (DEPAKOTE) DR tablet 500 mg, DULoxetine (CYMBALTA) DR capsule 30 mg, clonazePAM (KLONOPIN) tablet 1 mg         Current Medications (02/16/2023):  This is the current hospital active medication list Current Facility-Administered Medications  Medication Dose Route Frequency Provider Last Rate Last Admin   acetaminophen (TYLENOL) tablet 650 mg  650 mg Oral Q6H PRN Ward, Kristen N, DO   650 mg at 02/03/23 0957   albuterol (PROVENTIL) (2.5 MG/3ML) 0.083% nebulizer solution 2.5 mg  2.5 mg Nebulization Q4H PRN Selinda Eon, RPH       azelastine (ASTELIN) 0.1 % nasal spray 1 spray  1 spray Each Nare BID Ward, Kristen N, DO   1 spray at 02/13/23 2123   benztropine (COGENTIN) tablet 1 mg  1 mg Oral BID Ward, Kristen N, DO   1 mg at 02/16/23 1018   clonazePAM (KLONOPIN) tablet 1 mg  1 mg Oral BID Ward, Kristen N, DO   1 mg at 02/16/23 1018   cloNIDine (CATAPRES) tablet 0.1 mg  0.1 mg Oral Daily Drusilla Kanner, RPH   0.1 mg at 02/16/23 1017   cloNIDine (CATAPRES) tablet 0.2 mg  0.2 mg Oral QHS Drusilla Kanner, RPH   0.2  mg at 02/15/23 2144   divalproex (DEPAKOTE) DR tablet 500 mg  500 mg Oral Daily Charm Rings, NP   500 mg at 02/16/23 1017   divalproex (DEPAKOTE) DR tablet 750 mg  750 mg Oral QHS Charm Rings, NP   750 mg at 02/15/23 2144   DULoxetine (CYMBALTA) DR capsule 30 mg  30 mg Oral BID Ward, Kristen N, DO   30 mg at 02/16/23 1017   haloperidol (HALDOL) tablet 0.5 mg  0.5 mg Oral Daily PRN Ward, Kristen N, DO   0.5 mg at 01/23/23 1152   haloperidol (HALDOL) tablet 10 mg  10 mg Oral Daily Ward, Kristen N, DO   10 mg at 02/16/23 1017   hydrOXYzine (ATARAX) tablet 50 mg  50 mg Oral TID PRN Ward, Baxter Hire N, DO   50  mg at 01/23/23 1152   ipratropium (ATROVENT) 0.03 % nasal spray 2 spray  2 spray Nasal BID Ward, Kristen N, DO   2 spray at 02/13/23 2123   lisinopril (ZESTRIL) tablet 20 mg  20 mg Oral Daily Ward, Kristen N, DO   20 mg at 02/16/23 1018   lithium carbonate capsule 300 mg  300 mg Oral BID WC Charm Rings, NP   300 mg at 02/16/23 1631   lurasidone (LATUDA) tablet 60 mg  60 mg Oral Daily Ward, Kristen N, DO   60 mg at 02/16/23 1018   melatonin tablet 10 mg  10 mg Oral QHS Ward, Kristen N, DO   10 mg at 02/15/23 2145   mirabegron ER (MYRBETRIQ) tablet 25 mg  25 mg Oral Daily Ward, Kristen N, DO   25 mg at 02/16/23 1019   pantoprazole (PROTONIX) EC tablet 40 mg  40 mg Oral Daily Ward, Kristen N, DO   40 mg at 02/16/23 1017   risperiDONE (RISPERDAL) tablet 1 mg  1 mg Oral BID Charm Rings, NP   1 mg at 02/16/23 1018   Current Outpatient Medications  Medication Sig Dispense Refill   acetaminophen (TYLENOL) 325 MG tablet Take 650 mg by mouth every 6 (six) hours as needed.     albuterol (VENTOLIN HFA) 108 (90 Base) MCG/ACT inhaler      azelastine (ASTELIN) 0.1 % nasal spray Place 1 spray into both nostrils 2 (two) times daily. Use in each nostril as directed     benztropine (COGENTIN) 1 MG tablet Take 1 mg by mouth 2 (two) times daily.     clonazePAM (KLONOPIN) 1 MG tablet Take 1 mg by mouth 2 (two) times daily.     cloNIDine (CATAPRES) 0.1 MG tablet Take 0.1-0.2 mg by mouth 2 (two) times daily. 1 tab qam 2 tab qhs     diclofenac Sodium (VOLTAREN) 1 % GEL Apply 2 g topically 4 (four) times daily.     divalproex (DEPAKOTE ER) 500 MG 24 hr tablet Take 500 mg by mouth 2 (two) times daily.     DULoxetine (CYMBALTA) 30 MG capsule Take 30 mg by mouth 2 (two) times daily.     guaiFENesin (MUCINEX) 600 MG 12 hr tablet Take 600 mg by mouth 2 (two) times daily as needed.     haloperidol (HALDOL) 0.5 MG tablet Take 0.5 mg by mouth daily as needed for agitation.     haloperidol (HALDOL) 5 MG tablet Take 10  mg by mouth daily.     hydrOXYzine (ATARAX) 50 MG tablet Take 50 mg by mouth in the morning, at noon, and at bedtime.  ipratropium (ATROVENT) 0.03 % nasal spray 2 sprays every 12 (twelve) hours.     lisinopril (ZESTRIL) 20 MG tablet Take 20 mg by mouth daily.     lithium carbonate 150 MG capsule Take 150 mg by mouth daily.     Lurasidone HCl 60 MG TABS Take 1 tablet by mouth daily.     Melatonin 10 MG TABS Take 1 tablet by mouth at bedtime.     omeprazole (PRILOSEC) 20 MG capsule Take 20 mg by mouth daily.     risperiDONE (RISPERDAL) 0.5 MG tablet Take 0.5 mg by mouth 2 (two) times daily.     Vibegron (GEMTESA) 75 MG TABS Take 75 mg by mouth daily. 30 tablet 11     Discharge Medications: Please see discharge summary for a list of discharge medications.  Relevant Imaging Results:  Relevant Lab Results:   Additional Information SSN [id number]  Darleene Cleaver, LCSW

## 2023-02-16 NOTE — ED Notes (Signed)
Snack was provided with water. No other needs at this time   

## 2023-02-16 NOTE — ED Provider Notes (Signed)
Emergency Medicine Observation Re-evaluation Note  Louis Edwards is a 37 y.o. male, seen on rounds today.  Pt initially presented to the ED for complaints of Mental Health Problem  Currently, the patient is resting in bed. No reported issues overnight from nursing team.   Physical Exam  BP 116/82   Pulse 75   Temp (!) 97.3 F (36.3 C) (Oral)   Resp 17   Ht 5\' 5"  (1.651 m)   Wt 99.8 kg   SpO2 95%   BMI 36.61 kg/m  Physical Exam General: Resting comfortably in bed  ED Course / MDM  EKG:   I have reviewed the labs and medications over the past 24 hours. Recent changes in the last 24 hours are none.    Plan  Current plan is for dispo per social work.    Trinna Post, MD 02/16/23 [phone]

## 2023-02-16 NOTE — TOC Progression Note (Signed)
Transition of Care [facility]) - Progression Note    Patient Details  Name: Louis Edwards MRN: [id number] Date of Birth: October 30, 1985  Transition of Care [facility]) CM/SW Contact  Halford Chessman Phone Number: 02/16/2023, 6:46 PM  Clinical Narrative:     Updated MAR and notes emailed to [facility] to continue looking for placement.  Expected Discharge Plan: Group Home Barriers to Discharge:  (placement)  Expected Discharge Plan and Services       Living arrangements for the past 2 months: Group Home                                       Social Determinants of Health (SDOH) Interventions SDOH Screenings   Tobacco Use: Low Risk  (01/22/2023)    Readmission Risk Interventions     No data to display

## 2023-02-16 NOTE — ED Notes (Signed)
VOL/TOC Placement 

## 2023-02-17 LAB — CBC WITH DIFFERENTIAL/PLATELET
Abs Immature Granulocytes: 0.04 10*3/uL (ref 0.00–0.07)
Basophils Absolute: 0 10*3/uL (ref 0.0–0.1)
Basophils Relative: 0 %
Eosinophils Absolute: 0 10*3/uL (ref 0.0–0.5)
Eosinophils Relative: 0 %
HCT: 48.9 % (ref 39.0–52.0)
Hemoglobin: 16.4 g/dL (ref 13.0–17.0)
Immature Granulocytes: 1 %
Lymphocytes Relative: 16 %
Lymphs Abs: 1.2 10*3/uL (ref 0.7–4.0)
MCH: 31.4 pg (ref 26.0–34.0)
MCHC: 33.5 g/dL (ref 30.0–36.0)
MCV: 93.5 fL (ref 80.0–100.0)
Monocytes Absolute: 0.4 10*3/uL (ref 0.1–1.0)
Monocytes Relative: 6 %
Neutro Abs: 5.8 10*3/uL (ref 1.7–7.7)
Neutrophils Relative %: 77 %
Platelets: 197 10*3/uL (ref 150–400)
RBC: 5.23 MIL/uL (ref 4.22–5.81)
RDW: 12.5 % (ref 11.5–15.5)
WBC: 7.5 10*3/uL (ref 4.0–10.5)
nRBC: 0 % (ref 0.0–0.2)

## 2023-02-17 LAB — COMPREHENSIVE METABOLIC PANEL
ALT: 24 U/L (ref 0–44)
AST: 21 U/L (ref 15–41)
Albumin: 4.8 g/dL (ref 3.5–5.0)
Alkaline Phosphatase: 67 U/L (ref 38–126)
Anion gap: 11 (ref 5–15)
BUN: 15 mg/dL (ref 6–20)
CO2: 23 mmol/L (ref 22–32)
Calcium: 9.7 mg/dL (ref 8.9–10.3)
Chloride: 100 mmol/L (ref 98–111)
Creatinine, Ser: 0.99 mg/dL (ref 0.61–1.24)
GFR, Estimated: >60 mL/min (ref >60)
Glucose, Bld: 97 mg/dL (ref 70–99)
Potassium: 4.2 mmol/L (ref 3.5–5.1)
Sodium: 134 mmol/L — ABNORMAL LOW (ref 135–145)
Total Bilirubin: 0.7 mg/dL (ref 0.3–1.2)
Total Protein: 7.7 g/dL (ref 6.5–8.1)

## 2023-02-17 LAB — VALPROIC ACID LEVEL: Valproic Acid Lvl: 51 ug/mL (ref 50.0–100.0)

## 2023-02-17 NOTE — ED Notes (Signed)
VOL  TOC  PLACEMENT 

## 2023-02-17 NOTE — ED Notes (Signed)
Pt awoken from sleep. Lights turned on and pt told he needs to get up and move around. Pt opened eyes and verbalized understanding.

## 2023-02-17 NOTE — TOC Progression Note (Signed)
Transition of Care [facility]) - Progression Note    Patient Details  Name: Louis Edwards MRN: [id number] Date of Birth: May 31, 1986  Transition of Care [facility]) CM/SW Contact  Darleene Cleaver, [place] Phone Number: 02/17/2023, 8:54 PM  Clinical Narrative:     CSW received an email, per Horton Chin at [employer] [phone] funding increase was denied for [employer].  She is working on trying to find other placement options.  TOC to continue to follow patient's progress throughout discharge planning.  Expected Discharge Plan: Group Home Barriers to Discharge:  (placement)  Expected Discharge Plan and Services       Living arrangements for the past 2 months: Group Home                                       Social Determinants of Health (SDOH) Interventions SDOH Screenings   Tobacco Use: Low Risk  (01/22/2023)    Readmission Risk Interventions     No data to display

## 2023-02-17 NOTE — ED Notes (Signed)
This NT provided pt with PM snack.

## 2023-02-17 NOTE — ED Provider Notes (Signed)
Emergency Medicine Observation Re-evaluation Note  Louis Edwards is a 37 y.o. male, seen on rounds today.  Pt initially presented to the ED for complaints of Mental Health Problem  Currently, the patient is calm, no acute complaints.  Physical Exam  Blood pressure 108/75, pulse 80, temperature 98.5 F (36.9 C), temperature source Oral, resp. rate 15, height 5\' 5"  (1.651 m), weight 99.8 kg, SpO2 97 %. Physical Exam General: NAD Lungs: CTAB Psych: not agitated  ED Course / MDM  EKG:    I have reviewed the labs performed to date as well as medications administered while in observation.  Recent changes in the last 24 hours include no acute events overnight.    Plan  Current plan is for TOC placement.    Sharman Cheek, MD 02/17/23 [phone]

## 2023-02-17 NOTE — ED Notes (Signed)
HS medications being held until new med req complete.

## 2023-02-17 NOTE — ED Notes (Signed)
Pt difficult to arouse and appears very tired. Encouraged to get out of bed; Lights turn on in room. Pt moved self into sitting position.

## 2023-02-17 NOTE — ED Notes (Signed)
Pt up to restroom. Pt steady upon ambulation.

## 2023-02-17 NOTE — ED Provider Notes (Signed)
There was a concern the patient was being oversedated with all of his psychiatric medicines.  I recommended they discuss with the psychiatric team to see if they wanted to adjust his medications given he is on a significant amount of psychiatric medications.  I also asked that they confirm his medications with a medication list before discussing with psychiatry because if maybe some these medications are wrong then we can definitely adjust those ones without psychiatric recommendations.  We did do some repeat blood work to make sure that his Depakote level was normal and his labs are otherwise reassuring and these were both reassuring.    Concha Se, MD 02/17/23 [phone]

## 2023-02-17 NOTE — ED Notes (Addendum)
Writer contacted group home @ 2045 advising need medication list.  Writer gave Email address for list to be sent.  Have not received list as of 2130.

## 2023-02-17 NOTE — ED Notes (Signed)
Dinner tray given

## 2023-02-17 NOTE — ED Notes (Signed)
Pt received snack and water. 

## 2023-02-17 NOTE — ED Notes (Signed)
Pt laying on his bed. Pt notified lunch tray and drink dropped off to bedside.

## 2023-02-17 NOTE — ED Notes (Signed)
Provider currently reviewing pt's meds per Sue Lush, RN; this RN will give pt his meds once review complete.

## 2023-02-17 NOTE — ED Notes (Signed)
Medication list from group home dated 01/15/2023 received at 2205.

## 2023-02-17 NOTE — ED Notes (Signed)
Pt given phone to use briefly.  

## 2023-02-17 NOTE — ED Notes (Signed)
Pt resting on stretcher with eyes closed and even respirations. Pt reluctant to wake up and refuses to eat. Covered head with blanket and went back to sleep after briefly talking to this nurse.

## 2023-02-17 NOTE — ED Notes (Signed)
Will catch pt up on meds once Sue Lush RN confirms what meds provider wanted given or edited first as had previous convo with provider that pt was mildly lethargic this morning in comparison to his baseline. Pt currently at his baseline.

## 2023-02-18 NOTE — ED Notes (Signed)
Snack provided

## 2023-02-18 NOTE — ED Notes (Signed)
Patient complaining of leg pain, nurse will administer tylenol po.

## 2023-02-18 NOTE — ED Notes (Signed)
Patient ate 100% of lunch tray and beverage, no signs of distress.

## 2023-02-18 NOTE — ED Notes (Signed)
Patient took medications, refused the risperdal < states " It makes my tongue swell" Patient is needed, asking same questions over and over about leaving, He can be redirected, staff will continue to monitor for safety. He denies Si/hi or avh.

## 2023-02-18 NOTE — ED Provider Notes (Signed)
Emergency Medicine Observation Re-evaluation Note  Louis Edwards is a 37 y.o. male, seen on rounds today.  Pt initially presented to the ED for complaints of Mental Health Problem Currently, the patient is resting comfortably.  Physical Exam  BP 117/80 (BP Location: Left Arm)   Pulse 83   Temp 98.7 F (37.1 C) (Oral)   Resp 18   Ht 5\' 5"  (1.651 m)   Wt 99.8 kg   SpO2 97%   BMI 36.61 kg/m  Physical Exam General: No acute distress Cardiac: Well-perfused extremities Lungs: No respiratory distress Psych: Appropriate mood and affect  ED Course / MDM  EKG:   I have reviewed the labs performed to date as well as medications administered while in observation.  Recent changes in the last 24 hours include none.  Plan  Current plan is for placement.    Merwyn Katos, MD 02/18/23 [phone]

## 2023-02-18 NOTE — ED Notes (Signed)
Pt ate 100% of breakfast tray.

## 2023-02-18 NOTE — ED Notes (Signed)
Vol  pending  TOC  placement 

## 2023-02-18 NOTE — TOC Progression Note (Addendum)
Transition of Care [facility]) - Progression Note    Patient Details  Name: ORLANDUS ROBLEDO MRN: [id number] Date of Birth: November 28, 1985  [facility]) CM/SW Contact  Darolyn Rua, [place] Phone Number: 02/18/2023, 11:36 AM  Clinical Narrative:     Update: Territa with [facility] rcalled to report she should have confirmation in writing from group home this evening or tomorrow that they will pick pateitn up to go to [name] Friday.     CSW spoke with [place] with [facility] at [phone] she reports she is pending confirmation from [facility] regarding placement, states they are still reviewing clinicals but should have final decision today if they are able to accept patient at group home. She reports she will call me back.   Expected Discharge Plan: Group Home Barriers to Discharge:  (placement)  Expected Discharge Plan and Services       Living arrangements for the past 2 months: Group Home                                       Social Determinants of Health (SDOH) Interventions SDOH Screenings   Tobacco Use: Low Risk  (01/22/2023)    Readmission Risk Interventions     No data to display

## 2023-02-18 NOTE — ED Notes (Signed)
Patient is in the dayroom, He is without behavioral issues, will continue to monitor for safety.

## 2023-02-18 NOTE — ED Notes (Signed)
Patient had visitor , He was without behavioral issues, He was excited to have visitor, it was supervised.

## 2023-02-18 NOTE — ED Notes (Addendum)
Per EDP Dr. Katrinka Blazing ok to to restart cogentin, klonopin, depakote, cymbalta, and in the AM patient to restart lithium. Continue to hold risperidone, melatonin, and clonidine until psych team to reassess.

## 2023-02-19 NOTE — TOC Progression Note (Signed)
Transition of Care [facility]) - Progression Note    Patient Details  Name: Louis Edwards MRN: [id number] Date of Birth: 02-04-1986  Transition of Care [facility]) CM/SW Contact  Darolyn Rua, [place] Phone Number: 02/19/2023, 2:48 PM  Clinical Narrative:     CSW spoke with [place] with [facility] to review discharge planning for tomorrow. She reports placement fell through as facility was unable to accept the funding price [facility] was willing to pay.   She reports she has sent out more referrals, pending accepting facility at this time. She does report that patient's mother and guardian has concerns with how patient appears and sounds sedated when she speaks with him. Reports they are interested in finding out what meds have changed or been increased since patient arrived at hospital.   CSW has relayed questions to MD and RN for follow up and will inform Territa once answered.   Patient continues to pend placement at this time.    Expected Discharge Plan: Group Home Barriers to Discharge:  (placement)  Expected Discharge Plan and Services       Living arrangements for the past 2 months: Group Home                                       Social Determinants of Health (SDOH) Interventions SDOH Screenings   Tobacco Use: Low Risk  (01/22/2023)    Readmission Risk Interventions     No data to display

## 2023-02-19 NOTE — ED Notes (Signed)
Vol/toc.. 

## 2023-02-19 NOTE — ED Notes (Signed)
Pt was given bagel with cream cheese and water for snack

## 2023-02-19 NOTE — ED Notes (Signed)
VOL  PENDING  PLACEMENT 

## 2023-02-19 NOTE — ED Provider Notes (Signed)
Emergency Medicine Observation Re-evaluation Note  Louis Edwards is a 37 y.o. male, seen on rounds today.  Pt initially presented to the ED for complaints of Mental Health Problem Currently, the patient is resting comfortably.  Physical Exam  BP (!) 141/93 (BP Location: Right Arm)   Pulse 90   Temp 98.6 F (37 C) (Oral)   Resp 17   Ht 5\' 5"  (1.651 m)   Wt 99.8 kg   SpO2 96%   BMI 36.61 kg/m  Physical Exam General: No acute distress Cardiac: Well-perfused extremities Lungs: No respiratory distress Psych: Appropriate mood and affect  ED Course / MDM  EKG:   I have reviewed the labs performed to date as well as medications administered while in observation.  Recent changes in the last 24 hours include none.  Plan  Current plan is for placement.    Merwyn Katos, MD 02/19/23 [phone]

## 2023-02-20 NOTE — ED Notes (Signed)
VOL/TOC Placement 

## 2023-02-20 NOTE — ED Notes (Signed)
VOL/ TOC 

## 2023-02-20 NOTE — ED Notes (Signed)
Pt having visit from [facility] staff. Pt relations supervising visit

## 2023-02-20 NOTE — ED Notes (Signed)
Breakfast tray placed in pt's room. Pt resting at this time.

## 2023-02-20 NOTE — TOC Progression Note (Signed)
Transition of Care [facility]) - Progression Note    Patient Details  Name: Louis Edwards MRN: [id number] Date of Birth: 1985-11-26  Transition of Care [facility]) CM/SW Contact  Darolyn Rua, [place] Phone Number: 02/20/2023, 9:56 AM  Clinical Narrative:     Per Nelma Rothman at [facility] potential placement at [facility] care [facility] Synetta Fail [phone]).   They requested email of fl2 sent to [email or web address]  She reports Synetta Fail with family care home to do an interview and assessment with patient on Monday 5/20.   Expected Discharge Plan: Group Home Barriers to Discharge:  (placement)  Expected Discharge Plan and Services       Living arrangements for the past 2 months: Group Home                                       Social Determinants of Health (SDOH) Interventions SDOH Screenings   Tobacco Use: Low Risk  (01/22/2023)    Readmission Risk Interventions     No data to display

## 2023-02-20 NOTE — ED Provider Notes (Signed)
Emergency Medicine Observation Re-evaluation Note  Louis Edwards is a 37 y.o. male, seen on rounds today.  Pt initially presented to the ED for complaints of Mental Health Problem Currently, the patient is resting comfortably.  Physical Exam  BP (!) 152/86   Pulse 91   Temp 98.5 F (36.9 C) (Oral)   Resp 17   Ht 5\' 5"  (1.651 m)   Wt 99.8 kg   SpO2 95%   BMI 36.61 kg/m  Physical Exam General: No acute distress Cardiac: Well-perfused extremities Lungs: No respiratory distress Psych: Appropriate mood and affect  ED Course / MDM  EKG:   I have reviewed the labs performed to date as well as medications administered while in observation.  Recent changes in the last 24 hours include none.  Plan  Current plan is for placement.    Merwyn Katos, MD 02/20/23 [phone]

## 2023-02-21 NOTE — ED Provider Notes (Signed)
Emergency Medicine Observation Re-evaluation Note  Louis Edwards is a 37 y.o. male, seen on rounds today.  Pt initially presented to the ED for complaints of Mental Health Problem  Currently, the patient is resting in bed. No reported issues overnight from nursing team.   Physical Exam  BP 137/84 (BP Location: Left Arm)   Pulse (!) 54   Temp 97.6 F (36.4 C) (Oral)   Resp 18   Ht 5\' 5"  (1.651 m)   Wt 99.8 kg   SpO2 96%   BMI 36.61 kg/m  Physical Exam General: Resting comfortably in bed  ED Course / MDM  EKG:   I have reviewed the labs and medications over the past 24 hours. Recent changes in the last 24 hours are none.    Plan  Current plan is for TOC placement.    Trinna Post, MD 02/21/23 [phone]

## 2023-02-21 NOTE — ED Notes (Signed)
Dinner tray given

## 2023-02-21 NOTE — ED Notes (Signed)
This NT provided pt with pm snack at this time.

## 2023-02-22 NOTE — ED Notes (Signed)
Pt given nighttime snack. 

## 2023-02-22 NOTE — ED Provider Notes (Signed)
Emergency Medicine Observation Re-evaluation Note  Louis Edwards is a 37 y.o. male, seen on rounds today.  Pt initially presented to the ED for complaints of Mental Health Problem Currently, the patient is awaiting disposition. Physical Exam  BP 99/60 (BP Location: Right Arm)   Pulse 63   Temp 98.2 F (36.8 C) (Oral)   Resp 18   Ht 5\' 5"  (1.651 m)   Wt 99.8 kg   SpO2 94%   BMI 36.61 kg/m  Physical Exam General: calm  ED Course / MDM  No labs in past 24 hours  Plan  Awaiting disposition.    Phineas Semen, MD 02/22/23 [phone]

## 2023-02-22 NOTE — ED Notes (Addendum)
Patient inquiring about going to a facility with groups. Social worker Anterhaus notified via secure chat

## 2023-02-22 NOTE — ED Notes (Signed)
Patient is vol pending TOC placement 

## 2023-02-23 ENCOUNTER — Ambulatory Visit: Payer: Medicare Other | Admitting: Internal Medicine

## 2023-02-23 NOTE — TOC Progression Note (Signed)
Transition of Care [facility]) - Progression Note    Patient Details  Name: DORSE COUVERTIER MRN: [id number] Date of Birth: Jan 07, 1986  Transition of Care [facility]) CM/SW Contact  Darleene Cleaver, [place] Phone Number: 02/23/2023, 5:35 PM  Clinical Narrative:     TOC to continue to follow patient's progress throughout discharge planning.  TOC waiting for decision by group home.  Expected Discharge Plan: Group Home Barriers to Discharge:  (placement)  Expected Discharge Plan and Services       Living arrangements for the past 2 months: Group Home                                       Social Determinants of Health (SDOH) Interventions SDOH Screenings   Tobacco Use: Low Risk  (01/22/2023)    Readmission Risk Interventions     No data to display

## 2023-02-23 NOTE — ED Notes (Signed)
Pt given hospital provided dinner tray. 

## 2023-02-23 NOTE — ED Notes (Signed)
IVC/pending in-pt admit  

## 2023-02-23 NOTE — ED Notes (Signed)
Patient given breakfast tray.

## 2023-02-23 NOTE — ED Notes (Signed)
Lunch tray provided. 

## 2023-02-23 NOTE — Progress Notes (Unsigned)
Pt did not show for scheduled appointment.  

## 2023-02-23 NOTE — ED Notes (Signed)
Snack provided

## 2023-02-23 NOTE — ED Notes (Signed)
Patient given shower supplies and currently showering. 

## 2023-02-23 NOTE — ED Notes (Signed)
VOL/pending placement 

## 2023-02-24 NOTE — ED Notes (Signed)
Pt in day room with visitors from group home to perform interview for possible admission. Pt pleasant and clapping.

## 2023-02-24 NOTE — Progress Notes (Signed)
This Chap visited the Pt while on unit. Pt shared good news of this group home that has accepted him. Mirna Mires gave active listening and a prayer of thanksgiving as requested by the Pt  02/24/23 1400  Spiritual Encounters  Type of Visit Initial  Care provided to: Patient  Conversation partners present during encounter Nurse  Referral source Nurse (RN/NT/LPN)  Reason for visit Urgent spiritual support  OnCall Visit Yes  Spiritual Framework  Presenting Themes Meaning/purpose/sources of inspiration  Patient Stress Factors Lack of knowledge  Interventions  Spiritual Care Interventions Made Reconciliation with self/others;Reflective listening;Compassionate presence  Intervention Outcomes  Outcomes Awareness of support;Connection to spiritual care  Spiritual Care Plan  Spiritual Care Issues Still Outstanding No further spiritual care needs at this time (see row info)

## 2023-02-24 NOTE — ED Provider Notes (Signed)
Emergency Medicine Observation Re-evaluation Note  Louis Edwards is a 37 y.o. male, seen on rounds today.  Pt initially presented to the ED for complaints of Mental Health Problem Currently, the patient is resting comfortably.  Physical Exam  BP 101/61 (BP Location: Left Arm)   Pulse 61   Temp 97.9 F (36.6 C) (Oral)   Resp 18   Ht 5\' 5"  (1.651 m)   Wt 99.8 kg   SpO2 96%   BMI 36.61 kg/m  Physical Exam General: No acute distress Cardiac: Well-perfused extremities Lungs: No respiratory distress Psych: Appropriate mood and affect  ED Course / MDM  EKG:   I have reviewed the labs performed to date as well as medications administered while in observation.  Recent changes in the last 24 hours include none.  Plan  Current plan is for placement.    Merwyn Katos, MD 02/24/23 [phone]

## 2023-02-24 NOTE — ED Notes (Signed)
VOL  TOC  PLACEMENT 

## 2023-02-24 NOTE — ED Notes (Signed)
Patient frequently at nursing station to give updates on other peers. Writer redirected patient. Medication complaint. Anxious but cooperative with care. Patient laying in bed with eyes closed at this time. Respirations even and un-labored.

## 2023-02-24 NOTE — ED Notes (Signed)
Hospital meal provided, pt tolerated w/o complaints.  Waste discarded appropriately.  

## 2023-02-24 NOTE — ED Notes (Signed)
Snack was provided with soda. No other needs at this time   

## 2023-02-24 NOTE — ED Notes (Signed)
Snack given.

## 2023-02-24 NOTE — TOC Progression Note (Addendum)
Transition of Care [facility]) - Progression Note    Patient Details  Name: Louis Edwards MRN: [id number] Date of Birth: Nov 04, 1985  Transition of Care [facility]) CM/SW Contact  Darolyn Rua, [place] Phone Number: 02/24/2023, 11:02 AM  Clinical Narrative:     CSW met with Synetta Fail [phone]) from [facility] and assessor with patient at bedside. Patient reports being excited to see social worker and assessor, clapping hands and smiling.   Assessment completed, they report they believe patient will be a good fit for their care home.   They request tb Quantiferon test and fl2.   FL2 faxed to Synetta Fail at [phone]. RN and MD aware of tb test needing to be ordered and completed.   Synetta Fail will follow up with [facility] on funding details.   Expected Discharge Plan: Group Home Barriers to Discharge:  (placement)  Expected Discharge Plan and Services       Living arrangements for the past 2 months: Group Home                                       Social Determinants of Health (SDOH) Interventions SDOH Screenings   Tobacco Use: Low Risk  (01/22/2023)    Readmission Risk Interventions     No data to display

## 2023-02-24 NOTE — ED Notes (Signed)
Pt went to bathroom and said he was cold so this tech gave pt blanket.

## 2023-02-24 NOTE — ED Notes (Signed)
REFUSED SNACKS AT THIS MOMENT.

## 2023-02-24 NOTE — ED Notes (Signed)
BREAKFAST TRAY GIVEN 

## 2023-02-24 NOTE — ED Notes (Signed)
vol/toc.... 

## 2023-02-25 NOTE — ED Notes (Signed)
Patient sitting in the dayroom calm and cooperative. Nurse will continue to monitor for safety.

## 2023-02-25 NOTE — ED Provider Notes (Signed)
Emergency Medicine Observation Re-evaluation Note  Physical Exam   BP 110/75 (BP Location: Left Arm)   Pulse 81   Temp 98.2 F (36.8 C) (Oral)   Resp 18   Ht 5\' 5"  (1.651 m)   Wt 99.8 kg   SpO2 99%   BMI 36.61 kg/m   Patient appears in no acute distress.  ED Course / MDM   No reported events during my shift at the time of this note.   Pt is awaiting dispo from SW   Pilar Jarvis MD    Pilar Jarvis, MD 02/25/23 [phone]

## 2023-02-25 NOTE — ED Notes (Signed)
PM snack provided. 

## 2023-02-26 NOTE — ED Provider Notes (Signed)
Emergency Medicine Observation Re-evaluation Note  Physical Exam   BP 123/87 (BP Location: Left Arm)   Pulse 73   Temp 97.8 F (36.6 C) (Oral)   Resp 18   Ht 5\' 5"  (1.651 m)   Wt 99.8 kg   SpO2 95%   BMI 36.61 kg/m   Patient appears in no acute distress. Presently sleeping, snoring mildly.  ED Course / MDM   No reported events during my shift at the time of this note.   Pt is awaiting dispo from SW   Sharyn Creamer, MD 02/26/23 0730

## 2023-02-26 NOTE — ED Notes (Signed)
Lunch tray given to pt.

## 2023-02-26 NOTE — TOC Progression Note (Signed)
Transition of Care [facility]) - Progression Note    Patient Details  Name: Louis Edwards MRN: [id number] Date of Birth: Oct 04, 1986  Transition of Care [facility]) CM/SW Contact  Darleene Cleaver, [place] Phone Number: 02/26/2023, 2:38 PM  Clinical Narrative:     Patient has a potential group home [facility].  Group home requested a QuantiFERON-TB Gold Plus test to be completed.  Once the results are back group home can determine when they can accept patient.  TOC continuing to follow patient's progress throughout discharge planning.   Expected Discharge Plan: Group Home Barriers to Discharge:  (placement)  Expected Discharge Plan and Services       Living arrangements for the past 2 months: Group Home                                       Social Determinants of Health (SDOH) Interventions SDOH Screenings   Tobacco Use: Low Risk  (01/22/2023)    Readmission Risk Interventions     No data to display

## 2023-02-26 NOTE — ED Notes (Signed)
Vol/toc.. 

## 2023-02-26 NOTE — ED Notes (Signed)
Pt provided snack.

## 2023-02-27 NOTE — ED Notes (Signed)
Pt is acting inappropriate at times and touching others extremities and getting into other patients person space. Pt redirected and educated, clear guidelines made for pt to follow.

## 2023-02-27 NOTE — ED Notes (Signed)
Louis Edwards from group home visited with pt. Security and this RN monitoring.

## 2023-02-27 NOTE — ED Provider Notes (Signed)
Emergency Medicine Observation Re-evaluation Note  Louis Edwards is a 37 y.o. male, seen on rounds today.  Pt initially presented to the ED for complaints of Mental Health Problem   Physical Exam  BP (!) 93/52 (BP Location: Right Arm)   Pulse 63   Temp 98.5 F (36.9 C) (Oral)   Resp 15   Ht 5\' 5"  (1.651 m)   Wt 99.8 kg   SpO2 95%   BMI 36.61 kg/m  Physical Exam General: NAD  ED Course / MDM    Plan  Current plan is for SW.    Willy Eddy, MD 02/27/23 [phone]

## 2023-02-27 NOTE — TOC Progression Note (Signed)
Transition of Care [facility]) - Progression Note    Patient Details  Name: Louis Edwards MRN: [id number] Date of Birth: [birth year]  Transition of Care [facility]) CM/SW Contact  Darleene Cleaver, [place] Phone Number: 02/27/2023, 5:38 PM  Clinical Narrative:     CSW received email from Horton Chin, [phone] stating that she is working with [facility] and they are working on completing paperwork for patient to be approved to go to the group home.  CSW spoke to patient's mother who is his legal guardian and informed her that [facility] is working finding placement for patient.  CSW also informed mother that it is still unknown when bed will be ready for patient.  TOC to continue to follow patient's progress throughout discharge planning.  Expected Discharge Plan: Group Home Barriers to Discharge:  (placement)  Expected Discharge Plan and Services       Living arrangements for the past 2 months: Group Home                                       Social Determinants of Health (SDOH) Interventions SDOH Screenings   Tobacco Use: Low Risk  (01/22/2023)    Readmission Risk Interventions     No data to display

## 2023-02-27 NOTE — ED Notes (Signed)
Patient received lunch at 11:45am. Patient is currently resting at this time.

## 2023-02-27 NOTE — ED Notes (Signed)
Given snack. 

## 2023-02-27 NOTE — ED Notes (Signed)
VOL  TOC  PLACEMENT 

## 2023-02-28 LAB — QUANTIFERON-TB GOLD PLUS: QuantiFERON-TB Gold Plus: NEGATIVE

## 2023-02-28 LAB — QUANTIFERON-TB GOLD PLUS (RQFGPL)
QuantiFERON Mitogen Value: >10 IU/mL
QuantiFERON Nil Value: 0.01 IU/mL
QuantiFERON TB1 Ag Value: 0.01 IU/mL
QuantiFERON TB2 Ag Value: 0.01 IU/mL

## 2023-02-28 NOTE — ED Notes (Signed)
Hospital snack given to pt with a fresh cup of water. Pt made aware no other food would be provided until breakfast time tomorrow morning. 

## 2023-02-28 NOTE — ED Notes (Signed)
Pt refused breakfast tray. I asked him again when I took his vitals if he was sure he didn't want his tray. He declined again.

## 2023-02-28 NOTE — ED Provider Notes (Signed)
Emergency Medicine Observation Re-evaluation Note  LISA RASK is a 37 y.o. male, seen on rounds today.  Pt initially presented to the ED for complaints of Mental Health Problem   Physical Exam  BP 113/72   Pulse 66   Temp 98.1 F (36.7 C)   Resp 17   Ht 5\' 5"  (1.651 m)   Wt 99.8 kg   SpO2 96%   BMI 36.61 kg/m  Physical Exam Patient appears well, no acute distress, normal WOB  ED Course / MDM  EKG:   I have reviewed the labs performed to date as well as medications administered while in observation.  Recent changes in the last 24 hours include none.  Plan  Current plan is for SW disposition.    Georga Hacking, MD 02/28/23 [phone]

## 2023-03-01 NOTE — ED Notes (Signed)
Patient is vol pending TOC placement 

## 2023-03-01 NOTE — ED Notes (Signed)
VOL/ TOC 

## 2023-03-01 NOTE — ED Provider Notes (Signed)
Emergency Medicine Observation Re-evaluation Note  Louis Edwards is a 37 y.o. male, seen on rounds today.  Pt initially presented to the ED for complaints of Mental Health Problem Currently, the patient is calm, resting.  Physical Exam  BP 116/64 (BP Location: Right Arm)   Pulse (!) 56   Temp 98.2 F (36.8 C) (Oral)   Resp 14   Ht 5\' 5"  (1.651 m)   Wt 99.8 kg   SpO2 96%   BMI 36.61 kg/m    ED Course / MDM  EKG:   I have reviewed the labs performed to date as well as medications administered while in observation.  Recent changes in the last 24 hours include none.  Plan  Current plan is for Psych Admission/Dispo.    Shaune Pollack, MD 03/01/23 [phone]

## 2023-03-02 NOTE — ED Notes (Signed)
Hospital meal provided, pt tolerated w/o complaints.  Waste discarded appropriately.  

## 2023-03-02 NOTE — ED Provider Notes (Signed)
Emergency Medicine Observation Re-evaluation Note  Louis Edwards is a 37 y.o. male, seen on rounds today.  Pt initially presented to the ED for complaints of Mental Health Problem  Currently, the patient is resting in bed. No reported issues overnight from nursing team.   Physical Exam  BP 97/62   Pulse 60   Temp 98.2 F (36.8 C) (Oral)   Resp 16   Ht 5\' 5"  (1.651 m)   Wt 99.8 kg   SpO2 95%   BMI 36.61 kg/m  Physical Exam General: Resting comfortably in bed  ED Course / MDM  EKG:   I have reviewed the labs and medications over the past 24 hours. Recent changes in the last 24 hours are none.      Plan  Current plan is for TOC placement.    Trinna Post, MD 03/02/23 1025

## 2023-03-02 NOTE — ED Notes (Signed)
Patient was still sleeping when staff was giving out lunch.

## 2023-03-02 NOTE — ED Notes (Signed)
TOC Placement Pending

## 2023-03-02 NOTE — ED Notes (Signed)
Lunch meal was given at this time.

## 2023-03-03 NOTE — ED Notes (Signed)
VOL  TOC  PLACEMENT 

## 2023-03-03 NOTE — ED Notes (Addendum)
deleted

## 2023-03-03 NOTE — TOC Progression Note (Signed)
Transition of Care [facility]) - Progression Note    Patient Details  Name: Louis Edwards MRN: [id number] Date of Birth: March 31, 1986  Transition of Care [facility]) CM/SW Contact  Darleene Cleaver, [place] Phone Number: 03/03/2023, 10:06 AM  Clinical Narrative:     TOC emailed Quantiferon test results to [facility] Management worker Horton Chin.  Waiting for updates from [facility] when they can accept patient.  TOC to continue to follow patient's progress throughout discharge planning.  Expected Discharge Plan: Group Home Barriers to Discharge:  (placement)  Expected Discharge Plan and Services       Living arrangements for the past 2 months: Group Home                                       Social Determinants of Health (SDOH) Interventions SDOH Screenings   Tobacco Use: Low Risk  (01/22/2023)    Readmission Risk Interventions     No data to display

## 2023-03-03 NOTE — ED Notes (Signed)

## 2023-03-03 NOTE — ED Notes (Signed)
Lunch tray provided for pt

## 2023-03-03 NOTE — ED Provider Notes (Signed)
Emergency Medicine Observation Re-evaluation Note  METTHEW MINARD is a 37 y.o. male, seen on rounds today.  Pt initially presented to the ED for complaints of Mental Health Problem Currently, the patient is sitting in the day room, no issues overnight.  Physical Exam  BP 106/74 (BP Location: Left Arm)   Pulse 69   Temp 97.6 F (36.4 C) (Oral)   Resp 16   Ht 5\' 5"  (1.651 m)   Wt 99.8 kg   SpO2 96%   BMI 36.61 kg/m  Physical Exam Patient appears well, no acute distress, normal WOB    ED Course / MDM  EKG:   I have reviewed the labs performed to date as well as medications administered while in observation.  Recent changes in the last 24 hours include none.  Plan  Current plan is for dispo per social work.    Chesley Noon, MD 03/03/23 1005

## 2023-03-03 NOTE — ED Notes (Signed)
Pt received dinner tray and soda.

## 2023-03-04 NOTE — ED Notes (Signed)
Patient returned meal tray to this NT, refusing to eat breakfast. This NT educated the patient on the importance of eating something for breakfast. Patient stated "I have eaten those biscuits every morning". This NT asked patient if he would like to at least eat the oranges in his tray. Patient still refused.

## 2023-03-04 NOTE — ED Notes (Signed)
Patient given snack.  

## 2023-03-04 NOTE — ED Notes (Signed)
Patient given breakfast tray.

## 2023-03-04 NOTE — ED Notes (Signed)
Dinner tray provided

## 2023-03-04 NOTE — ED Provider Notes (Signed)
Emergency Medicine Observation Re-evaluation Note  Louis Edwards is a 37 y.o. male, seen on rounds today.  Pt initially presented to the ED for complaints of Mental Health Problem   Physical Exam  BP 106/73   Pulse 72   Temp 98.1 F (36.7 C)   Resp 18   Ht 5\' 5"  (1.651 m)   Wt 99.8 kg   SpO2 96%   BMI 36.61 kg/m  Physical Exam General: NAD  ED Course / MDM  EKG:   I have reviewed the labs performed to date as well as medications administered while in observation.  Recent changes in the last 24 hours include none.  Plan  Current plan is for [facility]    Willy Eddy, MD 03/04/23 [phone]

## 2023-03-04 NOTE — ED Notes (Addendum)
Lunch tray given. 

## 2023-03-05 NOTE — ED Notes (Signed)
VOL  TOC  PLACEMENT 

## 2023-03-05 NOTE — ED Notes (Addendum)
Patient given breakfast tray.

## 2023-03-05 NOTE — ED Notes (Signed)
Dinner tray provided for pt 

## 2023-03-05 NOTE — ED Provider Notes (Signed)
Emergency Medicine Observation Re-evaluation Note  Louis Edwards is a 37 y.o. male, currently boarding in the emergency department.  No acute events since last update.  Physical Exam  BP 98/75 (BP Location: Right Arm)   Pulse 78   Temp 98 F (36.7 C) (Oral)   Resp 18   Ht 5\' 5"  (1.651 m)   Wt 99.8 kg   SpO2 98%   BMI 36.61 kg/m   ED Course / MDM   No recent labs for review besides a negative QuantiFERON gold  Plan  Current plan is for placement to an appropriate living facility once available.  Patient is being evaluated by Child psychotherapist.Minna Antis, MD 03/05/23 [phone]

## 2023-03-06 NOTE — ED Notes (Signed)
VOL/Pending TOC Placement 

## 2023-03-06 NOTE — ED Notes (Signed)
Patient is up wanting to use the phone, He is polite, and calm, no behavioral issues noted.

## 2023-03-06 NOTE — ED Provider Notes (Signed)
Emergency Medicine Observation Re-evaluation Note  Louis Edwards is a 37 y.o. male, seen on rounds today.  Pt initially presented to the ED for complaints of Mental Health Problem Currently, the patient is calm, resting.  Physical Exam  BP 104/65 (BP Location: Left Arm)   Pulse 65   Temp 98.1 F (36.7 C) (Oral)   Resp 16   Ht 5\' 5"  (1.651 m)   Wt 99.8 kg   SpO2 98%   BMI 36.61 kg/m    ED Course / MDM  EKG:   I have reviewed the labs performed to date as well as medications administered while in observation.  Recent changes in the last 24 hours include none.  Plan  Current plan is for psych dispo.    Shaune Pollack, MD 03/06/23 1320

## 2023-03-06 NOTE — ED Notes (Signed)
Patient given snack and beverage.  

## 2023-03-06 NOTE — ED Notes (Signed)
Dinner tray provided

## 2023-03-06 NOTE — TOC Progression Note (Signed)
Transition of Care [facility]) - Progression Note    Patient Details  Name: Louis Edwards MRN: [id number] Date of Birth: 1985-12-19  Transition of Care [facility]) CM/SW Contact  Darleene Cleaver, [place] Phone Number: 03/06/2023, 9:23 PM  Clinical Narrative:     Patient will be going to [facility] once contract is completed with [facility] Management team.  Hopefully next week paperwork will be completed.  Expected Discharge Plan: Group Home Barriers to Discharge:  (placement)  Expected Discharge Plan and Services       Living arrangements for the past 2 months: Group Home                                       Social Determinants of Health (SDOH) Interventions SDOH Screenings   Tobacco Use: Low Risk  (01/22/2023)    Readmission Risk Interventions     No data to display

## 2023-03-06 NOTE — ED Notes (Signed)
Patient had visitor, supervised, no issues noted. Patient stayed calm and cooperative.

## 2023-03-06 NOTE — ED Notes (Signed)
Patient given snack.  

## 2023-03-07 NOTE — ED Notes (Signed)
VOL/ TOC 

## 2023-03-07 NOTE — ED Notes (Signed)
Pt having difficulty maintaining person space and keeping hands to self. Addressed by staff numerous times, measures taken to prevent this.

## 2023-03-07 NOTE — ED Notes (Signed)
Pt has been hyperactive, unable to maintain personal space, unable to keep hands to self, and not following commands. PRN given at this time

## 2023-03-07 NOTE — ED Notes (Signed)
Dinner given

## 2023-03-07 NOTE — ED Notes (Signed)
Pt. Alert and oriented, warm and dry, in no distress. Pt. Denies SI, HI, and AVH. Pt. Encouraged to let nursing staff know of any concerns or needs. 

## 2023-03-07 NOTE — ED Provider Notes (Signed)
Emergency Medicine Observation Re-evaluation Note  Louis Edwards is a 37 y.o. male, seen on rounds today.  Pt initially presented to the ED for complaints of Mental Health Problem   Physical Exam  BP 101/67 (BP Location: Left Arm)   Pulse 70   Temp 97.8 F (36.6 C) (Oral)   Resp 16   Ht 1.651 m (5\' 5" )   Wt 99.8 kg   SpO2 99%   BMI 36.61 kg/m  Physical Exam General: Patient is still in bed, no events overnight Cardiac:    ED Course / MDM    Plan  Current plan is for group home discharge, apparently goal is for next week    Louis Every, MD 03/07/23 [phone]

## 2023-03-07 NOTE — ED Notes (Signed)
Pt given nighttime snack. 

## 2023-03-08 NOTE — ED Notes (Signed)
Patient is vol pending TOC placement 

## 2023-03-08 NOTE — ED Provider Notes (Signed)
Emergency Medicine Observation Re-evaluation Note  Louis Edwards is a 37 y.o. male, seen on rounds today.  Pt initially presented to the ED for complaints of Mental Health Problem   Physical Exam  BP 106/74 (BP Location: Right Arm)   Pulse 88   Temp 97.9 F (36.6 C) (Oral)   Resp 18   Ht 5\' 5"  (1.651 m)   Wt 99.8 kg   SpO2 95%   BMI 36.61 kg/m  Physical Exam General: Patient is still in bed, no events overnight    ED Course / MDM    Plan  Current plan is for group home discharge (when available)      Sharyn Creamer, MD 03/08/23 [phone]

## 2023-03-08 NOTE — ED Notes (Signed)
Snack and water given

## 2023-03-08 NOTE — ED Notes (Signed)
vol/toc.... 

## 2023-03-08 NOTE — ED Notes (Signed)
Patient has had to be instructed to stay in room and not bother other patients several times (patient attempted to throw unopened crackers and peanut butter packets under other patient's door flaps into their room despite being told to throw them in the trash if he didn't want them, banging on wall between his room and next patient). Patient able to be redirected at this time.

## 2023-03-08 NOTE — ED Notes (Signed)
Patient reports feeling anxious, arguing with another patient about being annoyed with each other. PRN anxiety medication given as ordered.

## 2023-03-09 NOTE — ED Notes (Signed)
Pt has returned to sleep, no vitals or snack at this time

## 2023-03-09 NOTE — ED Notes (Addendum)
Pt dinner at bedside

## 2023-03-09 NOTE — ED Notes (Signed)
Hospital meal provided, pt tolerated w/o complaints.  Waste discarded appropriately.  

## 2023-03-09 NOTE — ED Notes (Signed)
VOL/Pending placement 

## 2023-03-09 NOTE — TOC Progression Note (Signed)
Transition of Care [facility]) - Progression Note    Patient Details  Name: Louis Edwards MRN: [id number] Date of Birth: 1986/02/06  Transition of Care [facility]) CM/SW Contact  Darleene Cleaver, [place] Phone Number: 03/09/2023, 5:36 PM  Clinical Narrative:     CSW still waiting for paperwork to be completed for patient to go to [facility], possibly this week.   Expected Discharge Plan: Group Home Barriers to Discharge:  (placement)  Expected Discharge Plan and Services       Living arrangements for the past 2 months: Group Home                                       Social Determinants of Health (SDOH) Interventions SDOH Screenings   Tobacco Use: Low Risk  (01/22/2023)    Readmission Risk Interventions     No data to display

## 2023-03-09 NOTE — ED Notes (Signed)
Pt has been sleeping during day due to PRN admin, pt is sleepy at this time

## 2023-03-09 NOTE — ED Notes (Signed)
Patient was sleeping while lunch tray was offered at this time.

## 2023-03-09 NOTE — ED Notes (Signed)
Snack provided

## 2023-03-09 NOTE — ED Notes (Signed)
Pt requesting PRN med for agitation and anxiety. Pt states pt in room 6 makes him feel upset. Pt encouraged to stay in room. Currently resting quietly on bed with lights off.

## 2023-03-09 NOTE — ED Notes (Signed)
Patient is now using the phone at this time.

## 2023-03-09 NOTE — ED Provider Notes (Signed)
Emergency Medicine Observation Re-evaluation Note  Louis Edwards is a 37 y.o. male, seen on rounds today.  Pt initially presented to the ED for complaints of Mental Health Problem  Currently, the patient is resting in bed. No reported issues overnight from nursing team.   Physical Exam  BP 104/66 (BP Location: Left Arm)   Pulse 69   Temp 98.4 F (36.9 C) (Oral)   Resp 17   Ht 5\' 5"  (1.651 m)   Wt 99.8 kg   SpO2 94%   BMI 36.61 kg/m  Physical Exam General: Resting comfortably in bed  ED Course / MDM     Plan  Current plan is for dispo per social work/group home discharge when available.    Trinna Post, MD 03/09/23 [phone]

## 2023-03-10 NOTE — ED Provider Notes (Signed)
Emergency Medicine Observation Re-evaluation Note  Louis Edwards is a 37 y.o. male, seen on rounds today.  Pt initially presented to the ED for complaints of Mental Health Problem   Physical Exam  BP 111/64 (BP Location: Right Arm)   Pulse 74   Temp 97.9 F (36.6 C) (Oral)   Resp 18   Ht 5\' 5"  (1.651 m)   Wt 99.8 kg   SpO2 98%   BMI 36.61 kg/m  Physical Exam General: sleeping comfortably, no distress    ED Course / MDM  EKG:   I have reviewed the labs performed to date as well as medications administered while in observation.  Recent changes in the last 24 hours include none.  Plan  Current plan is for disposition by SW team.     Georga Hacking, MD 03/10/23 [phone]

## 2023-03-10 NOTE — ED Notes (Signed)

## 2023-03-10 NOTE — ED Notes (Signed)
VOL/Still Pending Placement 

## 2023-03-10 NOTE — ED Notes (Signed)
Pt was offered his breakfast tray but pt refused to eat what was provided pt drunk his milk and orange juice.

## 2023-03-10 NOTE — ED Notes (Signed)
Pt given snack and beverage. 

## 2023-03-10 NOTE — TOC Progression Note (Signed)
Transition of Care [facility]) - Progression Note    Patient Details  Name: Louis Edwards MRN: [id number] Date of Birth: April 04, 1986  Transition of Care [facility]) CM/SW Contact  Darleene Cleaver, [place] Phone Number: 03/10/2023, 10:22 AM  Clinical Narrative:     CSW received email that [facility] is still working on the Teacher, English as a foreign language for patient to go to group home.  They are also exploring other facilities.  Expected Discharge Plan: Group Home Barriers to Discharge:  (placement)  Expected Discharge Plan and Services       Living arrangements for the past 2 months: Group Home                                       Social Determinants of Health (SDOH) Interventions SDOH Screenings   Tobacco Use: Low Risk  (01/22/2023)    Readmission Risk Interventions     No data to display

## 2023-03-11 MED ORDER — OXYCODONE-ACETAMINOPHEN 5-325 MG PO TABS
1.0000 | ORAL_TABLET | Freq: Once | ORAL | Status: AC
  Administered 2023-03-11: 1 via ORAL
  Filled 2023-03-11: qty 1

## 2023-03-11 MED ORDER — LORAZEPAM 2 MG PO TABS
2.0000 mg | ORAL_TABLET | Freq: Once | ORAL | Status: AC
  Administered 2023-03-11: 2 mg via ORAL
  Filled 2023-03-11: qty 1

## 2023-03-11 NOTE — ED Notes (Signed)
VOL/pending TOC placement 

## 2023-03-11 NOTE — ED Notes (Signed)
Refused breakfast tray . Beverage given
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
## 2023-03-11 NOTE — ED Notes (Signed)
Patient is alert, no signs of distress, He is needy, stands at the door and staring in at everyone and will try to turn the handle to come in at times, Patient wants to be able to leave soon from here and states " I hope I can go this week" Nurse listens to Patient , shows empathy and talks to him about being patient and using coping skills, Patient states " I just want to sleep because I have nothing else to do.

## 2023-03-11 NOTE — ED Notes (Signed)
Attempted to call guardian, no answer will attempt again soon.

## 2023-03-11 NOTE — ED Notes (Signed)
This tech gave patient dinner and beverage.
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
## 2023-03-11 NOTE — ED Notes (Signed)
Pt provided snack.
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
## 2023-03-11 NOTE — ED Notes (Signed)
Nurse attempted to call guardian Darl Pikes trammel) His MOM. No answer will try again.
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
## 2023-03-12 NOTE — ED Notes (Signed)
Dinner tray provided for pt 

## 2023-03-12 NOTE — ED Notes (Signed)

## 2023-03-12 NOTE — ED Notes (Signed)
VOL TOC placement 

## 2023-03-12 NOTE — TOC Progression Note (Signed)
Transition of Care [facility]) - Progression Note    Patient Details  Name: Louis Edwards MRN: [id number] Date of Birth: Jul 24, 1986  Transition of Care [facility]) CM/SW Contact  Darleene Cleaver, [place] Phone Number: 03/11/2023, 7:39 AM  Clinical Narrative:     Per [facility], waiting for [facility] to complete contract for patient to be accepted to their facility.  Expected Discharge Plan: Group Home Barriers to Discharge:  (placement)  Expected Discharge Plan and Services       Living arrangements for the past 2 months: Group Home                                       Social Determinants of Health (SDOH) Interventions SDOH Screenings   Tobacco Use: Low Risk  (01/22/2023)    Readmission Risk Interventions     No data to display

## 2023-03-12 NOTE — ED Notes (Signed)
Vol/toc.. 

## 2023-03-13 NOTE — ED Notes (Signed)
Provided pt with snacks: ginger aler, ice cream

## 2023-03-13 NOTE — ED Notes (Signed)
VOL/Pending TOC Placement 

## 2023-03-13 NOTE — TOC Progression Note (Signed)
Transition of Care [facility]) - Progression Note    Patient Details  Name: Louis Edwards MRN: [id number] Date of Birth: Feb 12, 1986  Transition of Care [facility]) CM/SW Contact  Darleene Cleaver, [place] Phone Number: 03/13/2023, 6:25 PM  Clinical Narrative:     Still waiting for paperwork to be completed by [facility].   Expected Discharge Plan: Group Home Barriers to Discharge:  (placement)  Expected Discharge Plan and Services       Living arrangements for the past 2 months: Group Home                                       Social Determinants of Health (SDOH) Interventions SDOH Screenings   Tobacco Use: Low Risk  (01/22/2023)    Readmission Risk Interventions     No data to display

## 2023-03-13 NOTE — ED Notes (Signed)
VOL  TOC  PLACEMENT 

## 2023-03-13 NOTE — ED Notes (Signed)
Pt was given dinner tray and beverage 

## 2023-03-13 NOTE — ED Notes (Addendum)
Laying 103/65, HR 73 sitting BP 109/67 Map 79, HR 80 Standing BP  114/70 Map 79, HR 83

## 2023-03-13 NOTE — ED Provider Notes (Signed)
Emergency Medicine Observation Re-evaluation Note  Physical Exam   BP 105/68 (BP Location: Right Arm)   Pulse 70   Temp 98.7 F (37.1 C) (Oral)   Resp 16   Ht 5\' 5"  (1.651 m)   Wt 99.8 kg   SpO2 98%   BMI 36.61 kg/m   Patient appears in no acute distress.  ED Course / MDM   No reported events during my shift at the time of this note.   Pt is awaiting dispo from SW   Pilar Jarvis MD    Pilar Jarvis, MD 03/13/23 [phone]

## 2023-03-13 NOTE — ED Notes (Signed)
Pt was given snack and water

## 2023-03-14 NOTE — ED Notes (Signed)
Patient took all po medications, He is calm and cooperative, denies Si/hi or avh, Nurse will continue to monitor for safety. Patient did ask for tylenol for right side pain, states that it started yesterday. He states it is sore, there are no bruises noted.

## 2023-03-14 NOTE — ED Notes (Signed)
Patient tray disposed of at this time.

## 2023-03-14 NOTE — ED Notes (Signed)
Patient came to the door and states " Im going back to lay down," He is calm and cooperative.

## 2023-03-14 NOTE — ED Notes (Signed)
Pt refused breakfast tray.  

## 2023-03-14 NOTE — ED Notes (Signed)
Patient took medications at this time and tolerated well. Dinner tray being given to patient at this time by Ashley, [place].

## 2023-03-14 NOTE — ED Notes (Signed)
Snack provided

## 2023-03-14 NOTE — ED Provider Notes (Signed)
Emergency Medicine Observation Re-evaluation Note  Louis Edwards is a 37 y.o. male, seen on rounds today.  Pt initially presented to the ED for complaints of Mental Health Problem  Currently, the patient is resting in bed. No reported issues overnight from nursing team.   Physical Exam  BP (!) 116/55 (BP Location: Right Arm)   Pulse 65   Temp 98.2 F (36.8 C) (Oral)   Resp 16   Ht 5\' 5"  (1.651 m)   Wt 99.8 kg   SpO2 100%   BMI 36.61 kg/m  Physical Exam General: Resting comfortably in bed  ED Course / MDM  EKG:   Plan  Current plan is for dispo per social work.    Trinna Post, MD 03/14/23 [phone]

## 2023-03-14 NOTE — ED Notes (Signed)
VOL/ TOC 

## 2023-03-14 NOTE — ED Notes (Addendum)
Patient requested to use bathroom. Patient came out and urinated all over the bathroom floor. Pt wont give a straight answer as to why he went on the floor. Cleaned up urine off floor and called EVS to come clean floor as well. New scrub pants and underwear given to patient to change into.

## 2023-03-14 NOTE — ED Notes (Signed)
Pt lunch at bedside. Tried waking pt up.

## 2023-03-14 NOTE — ED Notes (Signed)
Cleaned up patient room and disposed of any trash. Linens changed. New bottom sheet, top sheet, pillow case and blankets given. Vital signs being taken by [name], NT at this time.

## 2023-03-14 NOTE — ED Notes (Signed)
Pt in bed. Snack at bedside.

## 2023-03-15 NOTE — ED Notes (Signed)
Pt refused breakfast. Pt given cranberry juice.

## 2023-03-15 NOTE — ED Notes (Signed)
Pt given fresh paper scrub bottoms, dinner tray, cup of water and 1 soda.

## 2023-03-15 NOTE — ED Notes (Signed)
Pt up to restroom.

## 2023-03-15 NOTE — ED Notes (Signed)
Louis Edwards with EVS done cleaning shower.

## 2023-03-15 NOTE — ED Notes (Signed)
Pt had BM in shower area before taking a shower. Pt given towels by Byrd Hesselbach NT to clean it up. Will have EVS further clean shower once pt done taking shower. Pt slobbering; pt history of intermittently slobbering.

## 2023-03-15 NOTE — ED Notes (Signed)
Pt currently c/c with staff. Pt received lunch tray and drink.

## 2023-03-15 NOTE — ED Notes (Signed)
Pt c/c with staff currently. Pt declined offer of snack and drink.  

## 2023-03-15 NOTE — ED Notes (Addendum)
Patient had an another episode of incontinence. Cleaned up bathroom floor. Attempted to call EVS to clean bathroom floor as well will attempt to call them again. New scrubs and underwear were given to patient.

## 2023-03-15 NOTE — ED Notes (Signed)
Pt taking shower. Pt was given hygiene items and the following, 1 clean top, 1 clean bottom, with 1 pair of disposable underwear.  Pt changed out into clean clothing.  Staff disposed of all shower supplies.   

## 2023-03-15 NOTE — ED Notes (Signed)
Pt finished showering and slowly put his clothes on. Called EVS to assist in sanitizing the shower. April stated she will be by soon to help.

## 2023-03-15 NOTE — ED Provider Notes (Signed)
Emergency Medicine Observation Re-evaluation Note  Physical Exam   BP 115/74   Pulse (!) 56   Temp 98 F (36.7 C)   Resp 16   Ht 5\' 5"  (1.651 m)   Wt 99.8 kg   SpO2 92%   BMI 36.61 kg/m   Patient appears in no acute distress.  ED Course / MDM   No reported events during my shift at the time of this note.   Pt is awaiting dispo from SW   Pilar Jarvis MD    Pilar Jarvis, MD 03/15/23 [phone]

## 2023-03-15 NOTE — ED Notes (Signed)
Patient is vol pending TOC placement 

## 2023-03-16 NOTE — ED Notes (Signed)
Requesting crossword supplies. Crosswords provided.

## 2023-03-16 NOTE — ED Notes (Signed)
VOL/Still Pending Placement 

## 2023-03-16 NOTE — ED Provider Notes (Signed)
Emergency Medicine Observation Re-evaluation Note  Louis Edwards is a 37 y.o. male, seen on rounds today.  Pt initially presented to the ED for complaints of Mental Health Problem Currently, the patient is calm, resting.  Physical Exam  BP 106/68 (BP Location: Left Arm)   Pulse 73   Temp 98 F (36.7 C) (Oral)   Resp 18   Ht 5\' 5"  (1.651 m)   Wt 99.8 kg   SpO2 97%   BMI 36.61 kg/m  ED Course / MDM  EKG:   I have reviewed the labs performed to date as well as medications administered while in observation.  Recent changes in the last 24 hours include none.  Plan  Current plan is for TOC dispo.    Shaune Pollack, MD 03/16/23 [phone]

## 2023-03-16 NOTE — TOC Progression Note (Signed)
Transition of Care [facility]) - Progression Note    Patient Details  Name: Louis Edwards MRN: [id number] Date of Birth: Dec 24, 1985  Transition of Care [facility]) CM/SW Contact  Darleene Cleaver, [place] Phone Number: 03/16/2023, 5:30 PM  Clinical Narrative:    Still waiting for contract to be completed for [facility].   Expected Discharge Plan: Group Home Barriers to Discharge:  (placement)  Expected Discharge Plan and Services       Living arrangements for the past 2 months: Group Home                                       Social Determinants of Health (SDOH) Interventions SDOH Screenings   Tobacco Use: Low Risk  (01/22/2023)    Readmission Risk Interventions     No data to display

## 2023-03-16 NOTE — ED Notes (Signed)
Pt given PM snack at this time.  

## 2023-03-17 NOTE — ED Notes (Signed)
Dinner tray provided for pt 

## 2023-03-17 NOTE — ED Provider Notes (Signed)
Emergency Medicine Observation Re-evaluation Note  Louis Edwards is a 37 y.o. male, currently boarding in the emergency department.  No acute events since last update  Physical Exam  BP 102/69 (BP Location: Right Arm)   Pulse 72   Temp 98.3 F (36.8 C) (Oral)   Resp 20   Ht 5\' 5"  (1.651 m)   Wt 99.8 kg   SpO2 96%   BMI 36.61 kg/m    ED Course / MDM   No recent lab work available for review.  Plan  Current plan is for placement to an appropriate living facility once available.  Social worker is currently working with the patient to achieve this.    Minna Antis, MD 03/17/23 [phone]

## 2023-03-17 NOTE — ED Notes (Signed)
VOL  TOC  PLACEMENT 

## 2023-03-17 NOTE — ED Notes (Signed)
Pt. Alert and oriented, warm and dry, in no distress. Pt. Denies SI, HI, and AVH. Pt. Encouraged to let nursing staff know of any concerns or needs. 

## 2023-03-17 NOTE — ED Notes (Signed)
Pt given hospital provided snack. 

## 2023-03-17 NOTE — ED Notes (Addendum)

## 2023-03-17 NOTE — ED Notes (Signed)
Pt in restroom 

## 2023-03-18 NOTE — ED Notes (Signed)
VOL/pending placement 

## 2023-03-18 NOTE — ED Notes (Signed)
Snack was provided to pt. 

## 2023-03-18 NOTE — ED Notes (Signed)
Pt given dinner tray and drink. No other needs voiced at this time. 

## 2023-03-18 NOTE — ED Notes (Signed)
Report received from [facility]. Patient care assumed. Patient/RN introduction complete. Will continue to monitor.

## 2023-03-18 NOTE — ED Notes (Signed)
Patient given snack.  

## 2023-03-18 NOTE — ED Notes (Signed)

## 2023-03-18 NOTE — ED Notes (Signed)
Hospital meal provided, pt tolerated w/o complaints.  Waste discarded appropriately.  

## 2023-03-18 NOTE — ED Notes (Signed)
Patient at the door , wanted to use the phone, He is stable, no behavioral issues at this time.

## 2023-03-18 NOTE — ED Notes (Signed)
Breakfast tray provided. 

## 2023-03-19 NOTE — TOC Progression Note (Signed)
Transition of Care [facility]) - Progression Note    Patient Details  Name: TARRON KROLAK MRN: [id number] Date of Birth: 16-Jul-1986  Transition of Care [facility]) CM/SW Contact  Darleene Cleaver, [place] Phone Number: 03/19/2023, 7:35 PM  Clinical Narrative:     CSW received a phone call from [facility], [phone].  They have received approval for a psychologist to see patient tomorrow morning to complete a psychological examination on him to help try to find the most appropriate group home setting.  Per Alvis Lemmings they are also still working on [employer]er, [employer] for [facility].  TOC to continue to follow patient's progress throughout discharge planning.   Expected Discharge Plan: Group Home Barriers to Discharge:  (placement)  Expected Discharge Plan and Services       Living arrangements for the past 2 months: Group Home                                       Social Determinants of Health (SDOH) Interventions SDOH Screenings   Tobacco Use: Low Risk  (01/22/2023)    Readmission Risk Interventions     No data to display

## 2023-03-19 NOTE — ED Notes (Signed)
Pt came to door and requested paper and crayons. Pt laid on floor in day room to color. Tech checked on pt. Pt stated he was comfortable laying on the floor and was ok. Pt encouraged to go to his room. Pt stated he was tired of being in his room. Will continue to monitor.

## 2023-03-19 NOTE — TOC Progression Note (Signed)
Transition of Care [facility]) - Progression Note    Patient Details  Name: Louis Edwards MRN: [id number] Date of Birth: 1985-10-31  Transition of Care [facility]) CM/SW Contact  Darolyn Rua, [place] Phone Number: 03/19/2023, 2:29 PM  Clinical Narrative:     CSW received call from [place] with [facility]   She reports they are still working with [facility] on funding and have a resolution by Monday.   Expected Discharge Plan: Group Home Barriers to Discharge:  (placement)  Expected Discharge Plan and Services       Living arrangements for the past 2 months: Group Home                                       Social Determinants of Health (SDOH) Interventions SDOH Screenings   Tobacco Use: Low Risk  (01/22/2023)    Readmission Risk Interventions     No data to display

## 2023-03-19 NOTE — ED Notes (Signed)
VOL/Pending Placement 

## 2023-03-19 NOTE — ED Notes (Signed)
Pt given nighttime snack. 

## 2023-03-19 NOTE — ED Notes (Signed)
VOL  TOC  PLACEMENT 

## 2023-03-19 NOTE — TOC Progression Note (Addendum)
Transition of Care [facility]) - Progression Note    Patient Details  Name: Louis Edwards MRN: [id number] Date of Birth: September 15, 1986  [facility] CM/SW Contact  Darleene Cleaver, [place] Phone Number: 03/18/2023 3:30pm  Clinical Narrative:     CSW spoke to [facility], they are still waiting for funding to be finalized before patient can go to new group home called [facility]. Horton Chin is his case worker from [employer]. [phone].  CSW to follow up with [employer] tomorrow to get an update on funding progress  TOC to continue to follow patient's progress throughout discharge planning.  Expected Discharge Plan: Group Home Barriers to Discharge:  (placement)  Expected Discharge Plan and Services       Living arrangements for the past 2 months: Group Home                                       Social Determinants of Health (SDOH) Interventions SDOH Screenings   Tobacco Use: Low Risk  (01/22/2023)    Readmission Risk Interventions     No data to display

## 2023-03-19 NOTE — ED Provider Notes (Signed)
Emergency Medicine Observation Re-evaluation Note  Physical Exam   BP 118/68 (BP Location: Left Arm)   Pulse 67   Temp 98.5 F (36.9 C) (Oral)   Resp 18   Ht 5\' 5"  (1.651 m)   Wt 99.8 kg   SpO2 100%   BMI 36.61 kg/m   Patient appears in no acute distress.  ED Course / MDM   No reported events during my shift at the time of this note.   Pt is awaiting dispo from SW   Pilar Jarvis MD    Pilar Jarvis, MD 03/19/23 [phone]

## 2023-03-19 NOTE — ED Notes (Signed)
Pt given dinner tray and beverage  

## 2023-03-20 NOTE — ED Provider Notes (Signed)
Emergency Medicine Observation Re-evaluation Note  Louis Edwards is a 37 y.o. male, seen on rounds today.  Pt initially presented to the ED for complaints of Mental Health Problem  Currently, the patient is is no acute distress. Denies any concerns at this time.  Physical Exam  Blood pressure 110/72, pulse 69, temperature 98.5 F (36.9 C), temperature source Oral, resp. rate 18, height 5\' 5"  (1.651 m), weight 99.8 kg, SpO2 93 %.  Physical Exam: General: No apparent distress Pulm: Normal WOB Psych: No agitation     ED Course / MDM     I have reviewed the labs performed to date as well as medications administered while in observation.  Recent changes in the last 24 hours include: No acute events overnight.  Plan   Current plan: Patient awaiting social work disposition     Corena Herter, MD 03/20/23 [phone]

## 2023-03-20 NOTE — ED Notes (Signed)
Pt given nighttime snack. 

## 2023-03-20 NOTE — TOC Progression Note (Signed)
Transition of Care [facility]) - Progression Note    Patient Details  Name: Louis Edwards MRN: [id number] Date of Birth: 1986-03-24  Transition [facility] Care [facility]) CM/SW Contact  Darolyn Rua, [place] Phone Number: 03/20/2023, 3:39 PM  Clinical Narrative:     CSW notes psychological assessment was completed 12:30 today, CSW followed up with Dawn with [facility] at [phone] she reports that they expect the psychological evaluation report to be completed hopefully by Monday.   Expected Discharge Plan: Group Home Barriers to Discharge:  (placement)  Expected Discharge Plan and Services       Living arrangements for the past 2 months: Group Home                                       Social Determinants of Health (SDOH) Interventions SDOH Screenings   Tobacco Use: Low Risk  (01/22/2023)    Readmission Risk Interventions     No data to display

## 2023-03-20 NOTE — ED Notes (Signed)
Given dinner

## 2023-03-20 NOTE — ED Notes (Signed)
VOL/pending placement 

## 2023-03-20 NOTE — ED Notes (Addendum)
Pt being evaluated by [facility] sent by the inusarance company to assist with pt placement

## 2023-03-21 NOTE — ED Notes (Signed)
Pt was asked if he wanted to take a shower today and the pt shook his head no. Will ask at a later time.

## 2023-03-21 NOTE — ED Provider Notes (Signed)
Emergency Medicine Observation Re-evaluation Note  Louis Edwards is a 37 y.o. male, seen on rounds today.  Pt initially presented to the ED for complaints of Mental Health Problem  Currently, the patient is is no acute distress. Denies any concerns at this time, except he would like to have pancakes or [place] toast if possible with breakfast and does not want any more biscuits.  He advises that he does not like it is had too many buiscits.  Physical Exam   Vitals:   03/20/23 2028 03/21/23 0831  BP: 112/80 (!) 136/92  Pulse: 72 60  Resp: 18 16  Temp: 98.7 F (37.1 C) 97.8 F (36.6 C)  SpO2: 97% 93%     Physical Exam: Ambulatory in the room without distress or discomfort. General: No apparent distress Pulm: Normal WOB Psych: No agitation     ED Course / MDM     I have reviewed the labs performed to date as well as medications administered while in observation.  Recent changes in the last 24 hours include: No acute events overnight.  Plan   Current plan: Patient awaiting social work disposition  Updated diet plans to request new buiscuits     Sharyn Creamer, MD 03/21/23 [phone]

## 2023-03-21 NOTE — ED Notes (Signed)
Pt received meal tray and beverage at this time. 

## 2023-03-21 NOTE — ED Notes (Signed)
Pt informed by EDT and Security that his tv channel will not be changed again tonight due to pt already getting his tv channel changed multiple times to the channels of his request. Pt nodded his head and stated "Okay."

## 2023-03-21 NOTE — ED Notes (Signed)
Pt refused breakfast. Pt took beverages. Milk and cranberry juice.

## 2023-03-21 NOTE — ED Notes (Signed)
Pt given dinner tray.

## 2023-03-21 NOTE — ED Notes (Signed)
Pt received night time snack tray with a cup of water in a foam cup along with a cup of grapes and cheese. Pt took food to room to eat. Pt informed no other food will be given until breakfast time in the morning.  

## 2023-03-21 NOTE — ED Notes (Signed)
VOL/ TOC 

## 2023-03-21 NOTE — ED Notes (Signed)
Pt overheard  other pts in unit talking about their kidneys. Pt asked EDT "can I have my kidney's checked?" Pt unable to vocalize pain in any area specifically, nor was pt able to correctly identify where his kidneys were. Pt asked to have his kidneys checked "because I have had a lot of soda." EDT informed pt a note could be put in his chart. Pt satisfied with this plan.

## 2023-03-21 NOTE — ED Notes (Signed)
Pt refused snack 

## 2023-03-22 NOTE — ED Notes (Signed)
VS obtained. Snack received by pt. Pt brought snack to room to eat.  

## 2023-03-22 NOTE — ED Notes (Signed)
Pt given lunch tray, placed on bed due to pt being in shower.

## 2023-03-22 NOTE — ED Provider Notes (Signed)
Emergency Medicine Observation Re-evaluation Note  Louis Edwards is a 37 y.o. male, seen on rounds today.  Pt initially presented to the ED for complaints of Mental Health Problem No acute issues overnight per nursing staff.  Physical Exam  BP 113/72 (BP Location: Right Arm)   Pulse 75   Temp 97.7 F (36.5 C) (Oral)   Resp 19   Ht 5\' 5"  (1.651 m)   Wt 99.8 kg   SpO2 97%   BMI 36.61 kg/m  Physical Exam  Patient appears well, no acute distress, normal WOB    ED Course / MDM  EKG:   I have reviewed the labs performed to date as well as medications administered while in observation.  Recent changes in the last 24 hours include none.  Plan  Current plan is for dispo per social work.    Chesley Noon, MD 03/22/23 [phone]

## 2023-03-22 NOTE — ED Notes (Signed)
Pts breakfast tray and drink at bedside; pt currently sleeping. 

## 2023-03-22 NOTE — ED Notes (Signed)
Pts snack and drink at bedside; pt currently sleeping. 

## 2023-03-22 NOTE — ED Notes (Signed)
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
## 2023-03-22 NOTE — ED Notes (Signed)
Pt walking up to door to ask to use restroom. Pt was unable to make it to restroom before voiding. Pt given change of scrubs and shower supplies and encouraged to take a shower.

## 2023-03-22 NOTE — ED Notes (Signed)
Pt given dinner tray.

## 2023-03-23 NOTE — ED Provider Notes (Signed)
Emergency Medicine Observation Re-evaluation Note  Louis Edwards is a 37 y.o. male, seen on rounds today.  Pt initially presented to the ED for complaints of Mental Health Problem   Night staff reports he had no issues overnight.  He is currently sleeping comfortably  Physical Exam  BP 118/74 (BP Location: Right Arm)   Pulse 65   Temp 98.2 F (36.8 C) (Oral)   Resp 17   Ht 5\' 5"  (1.651 m)   Wt 99.8 kg   SpO2 99%   BMI 36.61 kg/m  Physical Exam  Patient currently sleeping without distress.   ED Course / MDM  EKG:   I have reviewed the labs performed to date as well as medications administered while in observation.  Recent changes in the last 24 hours include none.  Plan  Current plan is for dispo per social work.        Sharyn Creamer, MD 03/23/23 [phone]

## 2023-03-23 NOTE — ED Notes (Signed)
VOL/pending placement 

## 2023-03-23 NOTE — ED Notes (Signed)
Patient took po medications without difficulty , staff will continue to monitor, no behavioral issues, Patient denies Si/hi or avh.

## 2023-03-23 NOTE — ED Notes (Signed)
Pt. Alert and oriented, warm and dry, in no distress. Pt. Denies SI, HI, and AVH. Pt excited that a group home reps came and seen patient. Pt. Encouraged to let nursing staff know of any concerns or needs.  ENVIRONMENTAL ASSESSMENT Potentially harmful objects out of patient reach: Yes.   Personal belongings secured: Yes.   Patient dressed in hospital provided attire only: Yes.   Plastic bags out of patient reach: Yes.   Patient care equipment (cords, cables, call bells, lines, and drains) shortened, removed, or accounted for: Yes.   Equipment and supplies removed from bottom of stretcher: Yes.   Potentially toxic materials out of patient reach: Yes.   Sharps container removed or out of patient reach: Yes.  '

## 2023-03-23 NOTE — ED Notes (Signed)
Pt given snack and water

## 2023-03-23 NOTE — ED Notes (Signed)
2 women from [facility] here to interview pt.  Pt in dayroom with security, ER tech and RN.  Pt calm and cooperative during interview.

## 2023-03-23 NOTE — ED Notes (Signed)
VOL/Pending Placement 

## 2023-03-23 NOTE — ED Notes (Signed)
Pt given dinner tray at this time.  

## 2023-03-23 NOTE — ED Notes (Signed)
Attempted to call dietary with no success; pt did not receive dinner tray. Will call again.

## 2023-03-23 NOTE — ED Notes (Signed)
Called dietary and dinner tray will be delivered soon.

## 2023-03-23 NOTE — TOC Progression Note (Signed)
Transition of Care [facility]) - Progression Note    Patient Details  Name: Louis Edwards MRN: [id number] Date of Birth: 10/02/1986  [facility] Care [facility]) CM/SW Contact  Darolyn Rua, [place] Phone Number: 03/23/2023, 9:06 AM  Clinical Narrative:     CSW followed up with Dawn with [facility] at [phone] she reports psychological evaluation report was completed. They will review today, they report that [facility] may be able to accept as they were wanting an updated psychological eval, they will be sending to them and other potential placement options. Dawn is hopeful this evaluation report will aid in placement options.    Expected Discharge Plan: Group Home Barriers to Discharge:  (placement)  Expected Discharge Plan and Services       Living arrangements for the past 2 months: Group Home                                       Social Determinants of Health (SDOH) Interventions SDOH Screenings   Tobacco Use: Low Risk  (01/22/2023)    Readmission Risk Interventions     No data to display

## 2023-03-24 MED ORDER — BACITRACIN ZINC 500 UNIT/GM EX OINT
TOPICAL_OINTMENT | Freq: Two times a day (BID) | CUTANEOUS | Status: DC
  Administered 2023-03-25 – 2023-04-02 (×8): 1 via TOPICAL
  Filled 2023-03-24 (×20): qty 0.9

## 2023-03-24 NOTE — ED Notes (Signed)
Pt exited room and urinated on self in middle of floor in day room. Pt provided items to clean and change self, completed in restroom, discarded in cart by staff. Pt returns to room

## 2023-03-24 NOTE — ED Notes (Signed)
Pt provided with snack and drink 

## 2023-03-24 NOTE — ED Notes (Signed)
Dinner proved to patient.

## 2023-03-24 NOTE — ED Notes (Signed)
Patient used the bathroom at this time.

## 2023-03-24 NOTE — ED Notes (Signed)
Pt reports he is experiencing great toe pain to the first digit on his right foot. Upon further examination redness and swelling is noted to toe. No drainage seen. MD notified.

## 2023-03-24 NOTE — ED Notes (Signed)
Lunch tray and beverage provided 

## 2023-03-24 NOTE — ED Notes (Signed)
Informed Dr. Rosalia Hammers about patients toe and his pain. MD aware.

## 2023-03-24 NOTE — ED Notes (Signed)
Patient in room and laying down in bed. Patient stated that he is in no distress at this time.

## 2023-03-24 NOTE — ED Notes (Signed)
Patient was given a basin with warm water and told to place his right big toe in the water for 15 minutes. Patient was able to complete it with no problems.

## 2023-03-24 NOTE — ED Provider Notes (Signed)
Notified by RN that patient is having right great toe pain.  Patient evaluated at bedside.  Does have some inflammation and redness mainly along the medial aspect of the right great toenail.  Possible very small area of paronychia that is spontaneously draining.  Discussed with RN.  Will trial warm water soaks and topical bacitracin to see if this improves patient's symptoms.   Trinna Post, MD 03/24/23 [phone]

## 2023-03-25 ENCOUNTER — Encounter: Payer: Self-pay | Admitting: Family Medicine

## 2023-03-25 DIAGNOSIS — G4733 Obstructive sleep apnea (adult) (pediatric): Secondary | ICD-10-CM | POA: Diagnosis not present

## 2023-03-25 DIAGNOSIS — R4182 Altered mental status, unspecified: Secondary | ICD-10-CM | POA: Diagnosis not present

## 2023-03-25 DIAGNOSIS — I10 Essential (primary) hypertension: Secondary | ICD-10-CM

## 2023-03-25 DIAGNOSIS — T426X1A Poisoning by other antiepileptic and sedative-hypnotic drugs, accidental (unintentional), initial encounter: Principal | ICD-10-CM

## 2023-03-25 DIAGNOSIS — F6381 Intermittent explosive disorder: Secondary | ICD-10-CM | POA: Diagnosis not present

## 2023-03-25 DIAGNOSIS — L03031 Cellulitis of right toe: Secondary | ICD-10-CM | POA: Insufficient documentation

## 2023-03-25 LAB — BASIC METABOLIC PANEL
Anion gap: 5 (ref 5–15)
BUN: 14 mg/dL (ref 6–20)
CO2: 24 mmol/L (ref 22–32)
Calcium: 8.9 mg/dL (ref 8.9–10.3)
Chloride: 106 mmol/L (ref 98–111)
Creatinine, Ser: 0.9 mg/dL (ref 0.61–1.24)
GFR, Estimated: >60 mL/min (ref >60)
Glucose, Bld: 86 mg/dL (ref 70–99)
Potassium: 4.4 mmol/L (ref 3.5–5.1)
Sodium: 135 mmol/L (ref 135–145)

## 2023-03-25 LAB — MRSA NEXT GEN BY PCR, NASAL: MRSA by PCR Next Gen: NOT DETECTED

## 2023-03-25 LAB — CBC
HCT: 42.7 % (ref 39.0–52.0)
Hemoglobin: 14 g/dL (ref 13.0–17.0)
MCH: 31.1 pg (ref 26.0–34.0)
MCHC: 32.8 g/dL (ref 30.0–36.0)
MCV: 94.9 fL (ref 80.0–100.0)
Platelets: 155 10*3/uL (ref 150–400)
RBC: 4.5 MIL/uL (ref 4.22–5.81)
RDW: 12.3 % (ref 11.5–15.5)
WBC: 8.4 10*3/uL (ref 4.0–10.5)
nRBC: 0 % (ref 0.0–0.2)

## 2023-03-25 LAB — COMPREHENSIVE METABOLIC PANEL
ALT: 24 U/L (ref 0–44)
AST: 16 U/L (ref 15–41)
Albumin: 4.5 g/dL (ref 3.5–5.0)
Alkaline Phosphatase: 59 U/L (ref 38–126)
Anion gap: 10 (ref 5–15)
BUN: 15 mg/dL (ref 6–20)
CO2: 22 mmol/L (ref 22–32)
Calcium: 9.5 mg/dL (ref 8.9–10.3)
Chloride: 102 mmol/L (ref 98–111)
Creatinine, Ser: 0.98 mg/dL (ref 0.61–1.24)
GFR, Estimated: >60 mL/min (ref >60)
Glucose, Bld: 101 mg/dL — ABNORMAL HIGH (ref 70–99)
Potassium: 4.3 mmol/L (ref 3.5–5.1)
Sodium: 134 mmol/L — ABNORMAL LOW (ref 135–145)
Total Bilirubin: 0.8 mg/dL (ref 0.3–1.2)
Total Protein: 7.3 g/dL (ref 6.5–8.1)

## 2023-03-25 LAB — VALPROIC ACID LEVEL
Valproic Acid Lvl: 117 ug/mL — ABNORMAL HIGH (ref 50.0–100.0)
Valproic Acid Lvl: 100 ug/mL (ref 50.0–100.0)
Valproic Acid Lvl: 85 ug/mL (ref 50.0–100.0)

## 2023-03-25 LAB — URINALYSIS, ROUTINE W REFLEX MICROSCOPIC
Bilirubin Urine: NEGATIVE
Glucose, UA: NEGATIVE mg/dL
Hgb urine dipstick: NEGATIVE
Ketones, ur: NEGATIVE mg/dL
Leukocytes,Ua: NEGATIVE
Nitrite: NEGATIVE
Protein, ur: NEGATIVE mg/dL
Specific Gravity, Urine: 1.013 (ref 1.005–1.030)
pH: 6 (ref 5.0–8.0)

## 2023-03-25 LAB — CBC WITH DIFFERENTIAL/PLATELET
Abs Immature Granulocytes: 0.03 10*3/uL (ref 0.00–0.07)
Basophils Absolute: 0 10*3/uL (ref 0.0–0.1)
Basophils Relative: 0 %
Eosinophils Absolute: 0 10*3/uL (ref 0.0–0.5)
Eosinophils Relative: 0 %
HCT: 45.1 % (ref 39.0–52.0)
Hemoglobin: 15.2 g/dL (ref 13.0–17.0)
Immature Granulocytes: 0 %
Lymphocytes Relative: 24 %
Lymphs Abs: 2.4 10*3/uL (ref 0.7–4.0)
MCH: 31.1 pg (ref 26.0–34.0)
MCHC: 33.7 g/dL (ref 30.0–36.0)
MCV: 92.4 fL (ref 80.0–100.0)
Monocytes Absolute: 0.6 10*3/uL (ref 0.1–1.0)
Monocytes Relative: 6 %
Neutro Abs: 6.8 10*3/uL (ref 1.7–7.7)
Neutrophils Relative %: 70 %
Platelets: 193 10*3/uL (ref 150–400)
RBC: 4.88 MIL/uL (ref 4.22–5.81)
RDW: 12.4 % (ref 11.5–15.5)
WBC: 9.9 10*3/uL (ref 4.0–10.5)
nRBC: 0 % (ref 0.0–0.2)

## 2023-03-25 LAB — HIV ANTIBODY (ROUTINE TESTING W REFLEX): HIV Screen 4th Generation wRfx: NONREACTIVE

## 2023-03-25 LAB — FOLATE: Folate: 8.9 ng/mL (ref >5.9)

## 2023-03-25 LAB — LITHIUM LEVEL: Lithium Lvl: 0.97 mmol/L (ref 0.60–1.20)

## 2023-03-25 LAB — AMMONIA: Ammonia: 57 umol/L — ABNORMAL HIGH (ref 9–35)

## 2023-03-25 MED ORDER — AMOXICILLIN-POT CLAVULANATE 875-125 MG PO TABS: 1.0000 | ORAL_TABLET | Freq: Two times a day (BID) | ORAL | Status: DC

## 2023-03-25 MED ORDER — MAGNESIUM HYDROXIDE 400 MG/5ML PO SUSP: 30.0000 mL | Freq: Every day | ORAL | Status: DC | PRN

## 2023-03-25 MED ORDER — ONDANSETRON HCL 4 MG/2ML IJ SOLN: 4.0000 mg | Freq: Four times a day (QID) | INTRAMUSCULAR | Status: DC | PRN

## 2023-03-25 MED ORDER — LACTULOSE 10 GM/15ML PO SOLN
20.0000 g | Freq: Three times a day (TID) | ORAL | Status: DC
  Administered 2023-03-25 – 2023-03-27 (×5): 20 g via ORAL
  Filled 2023-03-25 (×6): qty 30

## 2023-03-25 MED ORDER — DOXYCYCLINE HYCLATE 100 MG PO TABS
100.0000 mg | ORAL_TABLET | Freq: Two times a day (BID) | ORAL | Status: DC
  Administered 2023-03-25 – 2023-03-29 (×9): 100 mg via ORAL
  Filled 2023-03-25 (×10): qty 1

## 2023-03-25 MED ORDER — DIVALPROEX SODIUM 500 MG PO DR TAB
500.0000 mg | DELAYED_RELEASE_TABLET | Freq: Two times a day (BID) | ORAL | Status: DC
  Administered 2023-03-25 – 2023-04-03 (×18): 500 mg via ORAL
  Filled 2023-03-25 (×18): qty 1

## 2023-03-25 MED ORDER — TRAZODONE HCL 50 MG PO TABS
25.0000 mg | ORAL_TABLET | Freq: Every evening | ORAL | Status: DC | PRN
  Administered 2023-03-31 – 2023-04-02 (×3): 25 mg via ORAL
  Filled 2023-03-25 (×3): qty 1

## 2023-03-25 MED ORDER — SODIUM CHLORIDE 0.9 % IV SOLN: INTRAVENOUS | Status: DC

## 2023-03-25 MED ORDER — ONDANSETRON HCL 4 MG PO TABS: 4.0000 mg | ORAL_TABLET | Freq: Four times a day (QID) | ORAL | Status: DC | PRN

## 2023-03-25 MED ORDER — SODIUM CHLORIDE 0.9 % IV BOLUS
1000.0000 mL | Freq: Once | INTRAVENOUS | Status: AC
  Administered 2023-03-25: 1000 mL via INTRAVENOUS

## 2023-03-25 MED ORDER — CLOTRIMAZOLE 1 % EX CREA
TOPICAL_CREAM | Freq: Two times a day (BID) | CUTANEOUS | Status: DC
  Filled 2023-03-25: qty 15

## 2023-03-25 MED ORDER — ACETAMINOPHEN 325 MG PO TABS
650.0000 mg | ORAL_TABLET | Freq: Four times a day (QID) | ORAL | Status: DC | PRN
  Administered 2023-03-27: 650 mg via ORAL
  Filled 2023-03-25: qty 2

## 2023-03-25 MED ORDER — ACETAMINOPHEN 650 MG RE SUPP: 650.0000 mg | Freq: Four times a day (QID) | RECTAL | Status: DC | PRN

## 2023-03-25 MED ORDER — ENOXAPARIN SODIUM 60 MG/0.6ML IJ SOSY
0.5000 mg/kg | PREFILLED_SYRINGE | INTRAMUSCULAR | Status: DC
  Filled 2023-03-25 (×4): qty 0.6

## 2023-03-25 NOTE — Plan of Care (Signed)
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
## 2023-03-25 NOTE — Assessment & Plan Note (Signed)
Lotrimin cream will be given.

## 2023-03-25 NOTE — Assessment & Plan Note (Signed)
-  This like secondary to mild valproic acid toxicity. - We will monitor his mental status. - Depakote will be held off. - We will hydrate him.

## 2023-03-25 NOTE — ED Provider Notes (Signed)
-----------------------------------------   1:33 AM on 03/25/2023 -----------------------------------------   Nursing concerned for patient incontinent of urine, dizzy with unsteady gait with psychomotor retardation tonight.  Blood work was repeated which demonstrates elevated valproic acid.  He will be brought to the major side of the emergency department for IV fluids.  Will consult hospitalist for evaluation and admission.   Irean Hong, MD 03/25/23 [phone]

## 2023-03-25 NOTE — Assessment & Plan Note (Signed)
-   We will continue his Haldol and lithium as well as psychotropic medications.

## 2023-03-25 NOTE — Assessment & Plan Note (Addendum)
-   Depakote to be held off. - We will continue hydration and follow level. - Psychiatry consult to be obtained. - I notified Dr. Toni Amend about the patient.

## 2023-03-25 NOTE — ED Notes (Signed)
Pt our of restroom, states he was unable to provide urine sample.
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
## 2023-03-25 NOTE — ED Notes (Signed)
Pt to be moved to ED 22 for treatment and fluids, report called to [place], Charity fundraiser

## 2023-03-25 NOTE — Progress Notes (Signed)
Anticoagulation monitoring(Lovenox):  37 yo male ordered Lovenox 40 mg Q24h    Filed Weights   01/22/23 2000  Weight: 99.8 kg (220 lb)   BMI 36.7    Lab Results  Component Value Date   CREATININE 0.98 03/25/2023   CREATININE 0.99 02/17/2023   CREATININE 0.96 01/22/2023   Estimated Creatinine Clearance: 113.2 mL/min (by C-G formula based on SCr of 0.98 mg/dL). Hemoglobin & Hematocrit     Component Value Date/Time   HGB 15.2 03/25/2023 0050   HCT 45.1 03/25/2023 0050     Per Protocol for Patient with estCrcl > 30 ml/min and BMI > 30, will transition to Lovenox 50 mg Q24h.

## 2023-03-25 NOTE — Progress Notes (Signed)
Mobility Specialist - Progress Note   03/25/23 1247  Mobility  Activity Ambulated independently in room;Ambulated independently to bathroom  Level of Assistance Modified independent, requires aide device or extra time  Assistive Device None  Distance Ambulated (ft) 15 ft  Activity Response Tolerated well  Mobility Referral Yes  $Mobility charge 1 Mobility  Mobility Specialist Start Time (ACUTE ONLY) 1057  Mobility Specialist Stop Time (ACUTE ONLY) 1109  Mobility Specialist Time Calculation (min) (ACUTE ONLY) 12 min   Pt sitting in recliner on RA upon arrival. Pt STS and ambulates into bathroom Independent with VC for safety and extra time to process tasks. Pt left in bathroom with NT entering room.   Terrilyn Saver  Mobility Specialist  03/25/23 12:50 PM

## 2023-03-25 NOTE — H&P (Addendum)
[place]   PATIENT NAME: Louis Edwards    MR#:  [id number]  DATE OF BIRTH:  1986-09-10  DATE OF ADMISSION:  01/22/2023  PRIMARY CARE PHYSICIAN: Anselm Jungling, NP   Patient is coming from: Group home.  REQUESTING/REFERRING PHYSICIAN: Chiquita Loth, MD  CHIEF COMPLAINT:   Chief Complaint  Patient presents with   Mental Health Problem    HISTORY OF PRESENT ILLNESS:  Louis Edwards is a 37 y.o. male with medical history significant for ADHD, ODD, and moderate correctional disability, who presented to the emergency room for psychiatry evaluation for aggression and homicidal ideations.  Per his guardian who is his mother the patient has tendency to go off on people with upset.  She reported patient being aggressive to staff members.  Patient denied any suicidal thoughts. His group home owner reported that he is a 2:1 for staff and had funding for 1:1 and cannot afford to have him return as he does not have staffing to meet his needs.  Psych cleared, TOC consult placed for resource assistance and possible placement.  Based on his low Lithium level and Depakote being on the lower end of normal, doses where adjusted along with his Risperdal to assist in his aggressiv tendencies.  His nurse reported tonight that he was dizzy with unsteady gait with psychomotor retardation, urinary incontinence and slurred speech with altered mental status.  No paresthesias or focal muscle weakness.  No tinnitus or vertigo.  No weakness seizures.  No chest pain or palpitations.  No cough or wheezing or dyspnea.  No dysuria, oliguria or hematuria or flank pain.   ED Course: Patient's vital signs were within normal.  Labs revealed mild hyponatremia 134" 101 with otherwise unremarkable CMP.  CBC was within normal.  UA showed no abnormalities. EKG as reviewed by me : None Imaging: None.  He was given 1 L bolus of IV normal saline.  He will be admitted to an observation medical telemetry bed for further  evaluation and management  PAST MEDICAL HISTORY:   Past Medical History:  Diagnosis Date   Hyperactivity disorder    Moderate intellectual disability    Oppositional defiant disorder     PAST SURGICAL HISTORY:   Bilateral myringotomy tubes and bladder surgery  SOCIAL HISTORY:   Social History   Tobacco Use   Smoking status: Never   Smokeless tobacco: Never  Substance Use Topics   Alcohol use: Never    FAMILY HISTORY:  History reviewed. No pertinent family history.  He denies any familial diseases.  DRUG ALLERGIES:   Allergies  Allergen Reactions   Keflex [Cephalexin]    Thorazine [Chlorpromazine]     REVIEW OF SYSTEMS:   ROS As per history of present illness. All pertinent systems were reviewed above. Constitutional, HEENT, cardiovascular, respiratory, GI, GU, musculoskeletal, neuro, psychiatric, endocrine, integumentary and hematologic systems were reviewed and are otherwise negative/unremarkable except for positive findings mentioned above in the HPI.   MEDICATIONS AT HOME:   Prior to Admission medications   Medication Sig Start Date End Date Taking? Authorizing Provider  acetaminophen (TYLENOL) 325 MG tablet Take 650 mg by mouth every 6 (six) hours as needed.   Yes [provider]  albuterol (VENTOLIN HFA) 108 (90 Base) MCG/ACT inhaler as needed. 04/12/21  Yes [provider]  azelastine (ASTELIN) 0.1 % nasal spray Place 1 spray into both nostrils 2 (two) times daily. Use in each nostril as directed   Yes [provider]  benztropine (  COGENTIN) 1 MG tablet Take 1 mg by mouth 2 (two) times daily.   Yes [provider]  clonazePAM (KLONOPIN) 1 MG tablet Take 1 mg by mouth 2 (two) times daily.   Yes [provider]  diclofenac Sodium (VOLTAREN) 1 % GEL Apply 2 g topically 4 (four) times daily.   Yes [provider]  divalproex (DEPAKOTE ER) 500 MG 24 hr tablet Take 500 mg by mouth 2 (two) times daily. 04/28/21  Yes  [provider]  DULoxetine (CYMBALTA) 30 MG capsule Take 30 mg by mouth 2 (two) times daily.   Yes [provider]  haloperidol (HALDOL) 0.5 MG tablet Take 0.5 mg by mouth daily as needed for agitation.   Yes [provider]  haloperidol (HALDOL) 5 MG tablet Take 10 mg by mouth daily.   Yes [provider]  lithium carbonate 150 MG capsule Take 150 mg by mouth daily.   Yes [provider]  cloNIDine (CATAPRES) 0.1 MG tablet Take 0.1-0.2 mg by mouth 2 (two) times daily. 1 tab qam 2 tab qhs Patient not taking: Reported on 02/17/2023    [provider]  guaiFENesin (MUCINEX) 600 MG 12 hr tablet Take 600 mg by mouth 2 (two) times daily as needed.    [provider]  hydrOXYzine (ATARAX) 50 MG tablet Take 50 mg by mouth in the morning, at noon, and at bedtime. Patient not taking: Reported on 02/17/2023    [provider]  ipratropium (ATROVENT) 0.03 % nasal spray 2 sprays every 12 (twelve) hours. Patient not taking: Reported on 02/17/2023    [provider]  lisinopril (ZESTRIL) 20 MG tablet Take 20 mg by mouth daily. Patient not taking: Reported on 02/17/2023 04/28/21   [provider]  Lurasidone HCl 60 MG TABS Take 1 tablet by mouth daily. Patient not taking: Reported on 02/17/2023    [provider]  Melatonin 10 MG TABS Take 1 tablet by mouth at bedtime.    [provider]  omeprazole (PRILOSEC) 20 MG capsule Take 20 mg by mouth daily. Patient not taking: Reported on 02/17/2023 04/28/21   [provider]  risperiDONE (RISPERDAL) 0.5 MG tablet Take 0.5 mg by mouth 2 (two) times daily. Patient not taking: Reported on 02/17/2023    [provider]  Vibegron (GEMTESA) 75 MG TABS Take 75 mg by mouth daily. Patient not taking: Reported on 02/17/2023 11/22/21   Vanna Scotland, MD      VITAL SIGNS:  Blood pressure 120/77, pulse 62, temperature (!) 97.5 F (36.4 C), temperature  source Oral, resp. rate 20, height 5\' 5"  (1.651 m), weight 99.8 kg, SpO2 100 %.  PHYSICAL EXAMINATION:  Physical Exam  GENERAL:  37 y.o.-year-old Caucasian male patient lying in the bed with no acute distress.  He was mildly lethargic and somnolent with slow response to questions. EYES: Pupils equal, round, reactive to light and accommodation. No scleral icterus. Extraocular muscles intact.  HEENT: Head atraumatic, normocephalic. Oropharynx and nasopharynx clear.  NECK:  Supple, no jugular venous distention. No thyroid enlargement, no tenderness.  LUNGS: Normal breath sounds bilaterally, no wheezing, rales,rhonchi or crepitation. No use of accessory muscles of respiration.  CARDIOVASCULAR: Regular rate and rhythm, S1, S2 normal. No murmurs, rubs, or gallops.  ABDOMEN: Soft, nondistended, nontender. Bowel sounds present. No organomegaly or mass.  EXTREMITIES: No pedal edema, cyanosis, or clubbing.  NEUROLOGIC: Cranial nerves II through XII are intact. Muscle strength 5/5 in all extremities. Sensation intact. Gait not checked.  PSYCHIATRIC: The patient is alert and oriented x 3.  Normal affect and good eye contact. SKIN: Right paronychia with drainage without significant tenderness.  No obvious other rash, lesion, or ulcer.   LABORATORY PANEL:   CBC Recent Labs  Lab 03/25/23 0454  WBC 8.4  HGB 14.0  HCT 42.7  PLT 155   ------------------------------------------------------------------------------------------------------------------  Chemistries  Recent Labs  Lab 03/25/23 0050  NA 134*  K 4.3  CL 102  CO2 22  GLUCOSE 101*  BUN 15  CREATININE 0.98  CALCIUM 9.5  AST 16  ALT 24  ALKPHOS 59  BILITOT 0.8   ------------------------------------------------------------------------------------------------------------------  Cardiac Enzymes No results for input(s): "TROPONINI" in the last 168  hours. ------------------------------------------------------------------------------------------------------------------  RADIOLOGY:  No results found.    IMPRESSION AND PLAN:  Assessment and Plan: * Altered mental status -This like secondary to mild valproic acid toxicity. - We will monitor his mental status. - Depakote will be held off. - We will hydrate him.  Valproic acid toxicity - Depakote to be held off. - We will continue hydration and follow level. - Psychiatry consult to be obtained. - I notified Dr. Toni Amend about the patient.  OSA (obstructive sleep apnea) Will continue home CPAP nightly.  Intermittent explosive disorder - We will continue his Haldol and lithium as well as psychotropic medications.  Paronychia of great toe, right Lotrimin cream will be given.  Essential hypertension - We will continue his antihypertensive therapy.   DVT prophylaxis: Lovenox.  Advanced Care Planning:  Code Status: full code.  Family Communication:  The plan of care was discussed in details with the patient (and family). I answered all questions. The patient agreed to proceed with the above mentioned plan. Further management will depend upon hospital course. Disposition Plan: Back to previous home environment Consults called: none.  All the records are reviewed and case discussed with ED provider.  Status is: Observation  I certify that at the time of admission, it is my clinical judgment that the patient will require extending less than 2 midnights.                            Dispo: The patient is from: Home              Anticipated d/c is to: Home              Patient currently is not medically stable to d/c.              Difficult to place patient: No  Hannah Beat M.D on 03/25/2023 at 5:56 AM  [facility]   From 7 PM-7 AM, contact night-coverage [email or web address]  CC: Primary care physician; Anselm Jungling, NP

## 2023-03-25 NOTE — Assessment & Plan Note (Signed)
Will continue home CPAP nightly.

## 2023-03-25 NOTE — ED Notes (Signed)
Pt hit call light in room, states he had urinated on floor and self again without exiting room. Had cleaned up floor with cloth he had in room. Pt provided items to clean self and change.   Pt behaviors and movements have been noted to be slower tonight than in the past. Pt had more saliva dripping from mouth during shift then prior interactions. Soft spoken and hard to hear

## 2023-03-25 NOTE — Evaluation (Signed)
Clinical/Bedside Swallow Evaluation Patient Details  Name: Louis Edwards MRN: [id number] Date of Birth: 1986/07/10  Today's Date: 03/25/2023 Time: SLP Start Time (ACUTE ONLY): 0805 SLP Stop Time (ACUTE ONLY): 0850 SLP Time Calculation (min) (ACUTE ONLY): 45 min  Past Medical History:  Past Medical History:  Diagnosis Date   Hyperactivity disorder    Moderate intellectual disability    Oppositional defiant disorder    Past Surgical History: History reviewed. No pertinent surgical history. HPI:  Pt is a 37 y.o. male with medical history significant for ADHD, ODD, and moderate correctional disability, who presented to the emergency room for psychiatry evaluation for aggression and homicidal ideations.  Per his guardian who is his mother the patient has tendency to go off on people with upset.  She reported patient being aggressive to staff members.  Patient denied any suicidal thoughts. His group home owner reported that he is a 2:1 for staff and had funding for 1:1 and cannot afford to have him return as he does not have staffing to meet his needs.  Psych cleared, TOC consult placed for resource assistance and possible placement.    Based on his low Lithium level and Depakote being on the lower end of normal, doses where adjusted along with his Risperdal to assist in his aggressiv tendencies.     His nurse reported tonight that he was dizzy with unsteady gait with psychomotor retardation, urinary incontinence and slurred speech with altered mental status.  No paresthesias or focal muscle weakness.  Last CXR was on 02/07/23: No acute intrathoracic process identified.    Assessment / Plan / Recommendation  Clinical Impression   Pt seen today for BSE.  Pt awake, verbal. Baseline Moderate intellectual disability. He followed some instructions w/ Min-Mod cues. Often chuckled, smiled.  Noted potential Medication issues and need for adjustment of doses, per MD notes. This can certainly impact pt's  overall functional w/ po intake/tasks also. On RA, afebrile. WBC wnl.   Pt appears to present w/ functional oropharyngeal phase swallowing w/ no oropharyngeal phase dysphagia noted, no neuromuscular deficits noted. Pt consumed po trials w/ No immediate, overt clinical s/s of aspiration during po trials. Pt IS impulsive when eating at times and has reduced insight for following general aspiration precautions including SMALL bites/sips.  Pt appears at reduced risk for aspiration following general aspiration precautions and when given Supervision during meals for follow through w/ precautions/tasks.   During po trials, pt consumed all consistencies w/ no overt coughing, decline in vocal quality, or change in respiratory presentation during/post trials. No decline in O2 sats. Oral phase appeared grossly [facility] w/ timely bolus management, mastication, and control of bolus propulsion for A-P transfer for swallowing. Noted min open-mouth munching/chewing when taking Larger bites of solids -- min less organized d/t the bolus size it appeared. Oral clearing achieved w/ all trial consistencies -- moistened, soft foods given. Suspect pt is at/near his Baseline w/ self-feeding behaviors. OM Exam appeared [facility] w/ no unilateral weakness noted. Speech Clear. Pt fed self w/ setup support; positioning to sit fully upright in bed for meal.   Recommend continue a fairly Regular consistency diet w/ well-Cut meats, moistened foods; Thin liquids -- would benefit from monitoring and Supervision d/t Impulsive eating/drinking behaviors. Recommend general aspiration precautions, reduce distractions during meals. Reflux precautions. Tray setup and sitting in chair Best for meals. Pills WHOLE vs CRUSHED in Puree for safer, easier swallowing.  Education given on Pills in Puree; food consistencies and easy to eat options;  general aspiration precautions to pt and NSG. MD/NSG to reconsult if any new needs arise during admit. NSG updated,  agreed. MD updated. Recommend Dietician f/u for support. SLP Visit Diagnosis: Dysphagia, unspecified (R13.10)    Aspiration Risk   (reduced following general aspiration precautions)    Diet Recommendation   continue a fairly Regular consistency diet w/ well-Cut meats, moistened foods; Thin liquids -- would benefit from monitoring and Supervision d/t Impulsive eating/drinking behaviors. Recommend general aspiration precautions, reduce distractions during meals. Reflux precautions. Tray setup and sitting in chair Best for meals.  Medication Administration: Whole meds with puree (vs need to Crush for safer swallowing)    Other  Recommendations Recommended Consults:  (Dietician f/u) Oral Care Recommendations: Oral care BID;Staff/trained caregiver to provide oral care    Recommendations for follow up therapy are one component of a multi-disciplinary discharge planning process, led by the attending physician.  Recommendations may be updated based on patient status, additional functional criteria and insurance authorization.  Follow up Recommendations No SLP follow up      Assistance Recommended at Discharge  intermittent  Functional Status Assessment Patient has had a recent decline in their functional status and demonstrates the ability to make significant improvements in function in a reasonable and predictable amount of time.  Frequency and Duration  (n/a)   (n/a)       Prognosis Prognosis for improved oropharyngeal function: Good Barriers to Reach Goals: Cognitive deficits;Time post onset;Severity of deficits;Behavior;Medication Barriers/Prognosis Comment: see Baseline H&P issues      Swallow Study   General Date of Onset: 03/25/23 HPI: Pt is a 37 y.o. male with medical history significant for ADHD, ODD, and moderate correctional disability, who presented to the emergency room for psychiatry evaluation for aggression and homicidal ideations.  Per his guardian who is his mother the  patient has tendency to go off on people with upset.  She reported patient being aggressive to staff members.  Patient denied any suicidal thoughts. His group home owner reported that he is a 2:1 for staff and had funding for 1:1 and cannot afford to have him return as he does not have staffing to meet his needs.  Psych cleared, TOC consult placed for resource assistance and possible placement.    Based on his low Lithium level and Depakote being on the lower end of normal, doses where adjusted along with his Risperdal to assist in his aggressiv tendencies.     His nurse reported tonight that he was dizzy with unsteady gait with psychomotor retardation, urinary incontinence and slurred speech with altered mental status.  No paresthesias or focal muscle weakness.  Last CXR was on 02/07/23: No acute intrathoracic process identified. Type of Study: Bedside Swallow Evaluation Previous Swallow Assessment: none Diet Prior to this Study: Regular;Thin liquids (Level 0) Temperature Spikes Noted: No (wbc 8.4) Respiratory Status: Room air History of Recent Intubation: No Behavior/Cognition: Alert;Cooperative;Pleasant mood;Confused;Distractible;Requires cueing (baseline Moderate intellectual disability.) Oral Cavity Assessment: Within Functional Limits Oral Care Completed by SLP: Recent completion by staff Oral Cavity - Dentition: Adequate natural dentition Vision: Functional for self-feeding Self-Feeding Abilities: Able to feed self;Needs assist;Needs set up (Impulsive) Patient Positioning: Upright in bed (needed positioning support) Baseline Vocal Quality: Normal Volitional Cough: Strong Volitional Swallow: Unable to elicit (poor follow through)    Oral/Motor/Sensory Function Overall Oral Motor/Sensory Function: Within functional limits   Ice Chips Ice chips: Not tested   Thin Liquid Thin Liquid: Within functional limits Presentation: Cup;Self Fed;Straw (~12 ozs total)  Nectar Thick Nectar Thick Liquid:  Not tested   Honey Thick Honey Thick Liquid: Not tested   Puree Puree: Within functional limits Presentation: Self Fed;Spoon (7-8 trials)   Solid     Solid: Within functional limits Presentation: Self Fed;Spoon (full egg and veggie omelette) Other Comments: impulsive w/ larger bites -- monitored       Jerilynn Som, MS, CCC-SLP Speech Language Pathologist Rehab Services; [facility] [place] [phone] (ascom)  Louis Edwards 03/25/2023,1:49 PM

## 2023-03-25 NOTE — Assessment & Plan Note (Signed)
-   We will continue his antihypertensive therapy. 

## 2023-03-25 NOTE — Plan of Care (Addendum)
Patient was seen and examined at bedside, patient is walking around well in the hallway.  Denied any complaints.  Patient was asking if we can discontinue telemetry and IV fluid.  Patient has some pain in the right great toe, no any other complaints. Patient was admitted due to concern of valproic acid toxicity.  Valproic acid level 117, slightly elevated.  Discussed with psych, decreased valproic acid dose, started valproic acid 500 twice daily Continue to monitor valproic acid level, check ammonia level. Patient is awaiting for placement to group home after insurance approval for 2:1 care. Right toe cellulitis, started Augmentin twice daily for 5 days.

## 2023-03-26 DIAGNOSIS — G4733 Obstructive sleep apnea (adult) (pediatric): Secondary | ICD-10-CM | POA: Diagnosis present

## 2023-03-26 DIAGNOSIS — R4781 Slurred speech: Secondary | ICD-10-CM | POA: Diagnosis present

## 2023-03-26 DIAGNOSIS — R32 Unspecified urinary incontinence: Secondary | ICD-10-CM | POA: Diagnosis present

## 2023-03-26 DIAGNOSIS — I10 Essential (primary) hypertension: Secondary | ICD-10-CM | POA: Diagnosis present

## 2023-03-26 DIAGNOSIS — T426X5A Adverse effect of other antiepileptic and sedative-hypnotic drugs, initial encounter: Secondary | ICD-10-CM | POA: Diagnosis present

## 2023-03-26 DIAGNOSIS — F71 Moderate intellectual disabilities: Secondary | ICD-10-CM | POA: Diagnosis present

## 2023-03-26 DIAGNOSIS — Z79899 Other long term (current) drug therapy: Secondary | ICD-10-CM | POA: Diagnosis not present

## 2023-03-26 DIAGNOSIS — R509 Fever, unspecified: Secondary | ICD-10-CM | POA: Diagnosis not present

## 2023-03-26 DIAGNOSIS — Z751 Person awaiting admission to adequate facility elsewhere: Secondary | ICD-10-CM | POA: Diagnosis not present

## 2023-03-26 DIAGNOSIS — F913 Oppositional defiant disorder: Secondary | ICD-10-CM | POA: Diagnosis present

## 2023-03-26 DIAGNOSIS — R2681 Unsteadiness on feet: Secondary | ICD-10-CM | POA: Diagnosis present

## 2023-03-26 DIAGNOSIS — F6381 Intermittent explosive disorder: Secondary | ICD-10-CM | POA: Diagnosis present

## 2023-03-26 DIAGNOSIS — E7220 Disorder of urea cycle metabolism, unspecified: Secondary | ICD-10-CM | POA: Diagnosis present

## 2023-03-26 DIAGNOSIS — Y92009 Unspecified place in unspecified non-institutional (private) residence as the place of occurrence of the external cause: Secondary | ICD-10-CM | POA: Diagnosis not present

## 2023-03-26 DIAGNOSIS — L03031 Cellulitis of right toe: Secondary | ICD-10-CM | POA: Diagnosis present

## 2023-03-26 DIAGNOSIS — T426X1A Poisoning by other antiepileptic and sedative-hypnotic drugs, accidental (unintentional), initial encounter: Secondary | ICD-10-CM | POA: Diagnosis present

## 2023-03-26 DIAGNOSIS — R4182 Altered mental status, unspecified: Secondary | ICD-10-CM | POA: Diagnosis present

## 2023-03-26 DIAGNOSIS — F909 Attention-deficit hyperactivity disorder, unspecified type: Secondary | ICD-10-CM | POA: Diagnosis present

## 2023-03-26 DIAGNOSIS — R41843 Psychomotor deficit: Secondary | ICD-10-CM | POA: Diagnosis present

## 2023-03-26 DIAGNOSIS — E871 Hypo-osmolality and hyponatremia: Secondary | ICD-10-CM | POA: Diagnosis present

## 2023-03-26 DIAGNOSIS — R45850 Homicidal ideations: Secondary | ICD-10-CM | POA: Diagnosis present

## 2023-03-26 DIAGNOSIS — R4689 Other symptoms and signs involving appearance and behavior: Secondary | ICD-10-CM | POA: Diagnosis not present

## 2023-03-26 LAB — CBC
HCT: 43 % (ref 39.0–52.0)
Hemoglobin: 14.3 g/dL (ref 13.0–17.0)
MCH: 31.1 pg (ref 26.0–34.0)
MCHC: 33.3 g/dL (ref 30.0–36.0)
MCV: 93.5 fL (ref 80.0–100.0)
Platelets: 175 10*3/uL (ref 150–400)
RBC: 4.6 MIL/uL (ref 4.22–5.81)
RDW: 12.5 % (ref 11.5–15.5)
WBC: 7.9 10*3/uL (ref 4.0–10.5)
nRBC: 0 % (ref 0.0–0.2)

## 2023-03-26 LAB — BASIC METABOLIC PANEL
Anion gap: 9 (ref 5–15)
BUN: 12 mg/dL (ref 6–20)
CO2: 21 mmol/L — ABNORMAL LOW (ref 22–32)
Calcium: 9.4 mg/dL (ref 8.9–10.3)
Chloride: 107 mmol/L (ref 98–111)
Creatinine, Ser: 0.82 mg/dL (ref 0.61–1.24)
GFR, Estimated: >60 mL/min (ref >60)
Glucose, Bld: 97 mg/dL (ref 70–99)
Potassium: 4 mmol/L (ref 3.5–5.1)
Sodium: 137 mmol/L (ref 135–145)

## 2023-03-26 LAB — HEPATIC FUNCTION PANEL
ALT: 28 U/L (ref 0–44)
AST: 20 U/L (ref 15–41)
Albumin: 4.1 g/dL (ref 3.5–5.0)
Alkaline Phosphatase: 59 U/L (ref 38–126)
Bilirubin, Direct: 0.1 mg/dL (ref 0.0–0.2)
Indirect Bilirubin: 0.6 mg/dL (ref 0.3–0.9)
Total Bilirubin: 0.7 mg/dL (ref 0.3–1.2)
Total Protein: 6.8 g/dL (ref 6.5–8.1)

## 2023-03-26 LAB — MAGNESIUM: Magnesium: 2 mg/dL (ref 1.7–2.4)

## 2023-03-26 LAB — VALPROIC ACID LEVEL: Valproic Acid Lvl: 90 ug/mL (ref 50.0–100.0)

## 2023-03-26 LAB — PHOSPHORUS: Phosphorus: 4.9 mg/dL — ABNORMAL HIGH (ref 2.5–4.6)

## 2023-03-26 NOTE — Progress Notes (Signed)
Mobility Specialist - Progress Note   03/26/23 1047  Mobility  Activity Ambulated with assistance in hallway;Stood at bedside;Dangled on edge of bed  Level of Assistance Standby assist, set-up cues, supervision of patient - no hands on  Assistive Device None  Distance Ambulated (ft) 350 ft  Activity Response Tolerated well  Mobility Referral Yes  $Mobility charge 1 Mobility  Mobility Specialist Start Time (ACUTE ONLY) 1020  Mobility Specialist Stop Time (ACUTE ONLY) 1037  Mobility Specialist Time Calculation (min) (ACUTE ONLY) 17 min   Pt supine in bed on RA upon arrival. Pt completes bed mobility indep. Pt needs MaxA to don socks. Pt STS and ambulates in hallway Supervision. Pt returns to bed with needs in reach.   Terrilyn Saver  Mobility Specialist  03/26/23 10:49 AM

## 2023-03-26 NOTE — Progress Notes (Signed)
[facility] Progress Note  Patient: Louis Edwards    ZOX:[id number]  DOA: 01/22/2023     Date of Service: the patient was seen and examined on 03/26/2023  Chief Complaint  Patient presents with   Mental Health Problem   Brief hospital course: RUSSEL RUMMLER is a 37 y.o. male with medical history significant for ADHD, ODD, and moderate correctional disability, who presented to the emergency room for psychiatry evaluation for aggression and homicidal ideations.  Per his guardian who is his mother the patient has tendency to go off on people with upset.  She reported patient being aggressive to staff members.  Patient denied any suicidal thoughts. His group home owner reported that he is a 2:1 for staff and had funding for 1:1 and cannot afford to have him return as he does not have staffing to meet his needs.  Psych cleared, TOC consult placed for resource assistance and possible placement.  Based on his low Lithium level and Depakote being on the lower end of normal, doses where adjusted along with his Risperdal to assist in his aggressiv tendencies.   His nurse reported tonight that he was dizzy with unsteady gait with psychomotor retardation, urinary incontinence and slurred speech with altered mental status.  No paresthesias or focal muscle weakness.  No tinnitus or vertigo.  No weakness seizures.  No chest pain or palpitations.  No cough or wheezing or dyspnea.  No dysuria, oliguria or hematuria or flank pain.   ED Course: Patient's vital signs were within normal.  Labs revealed mild hyponatremia 134" 101 with otherwise unremarkable CMP.  CBC was within normal.  UA showed no abnormalities. EKG as reviewed by me : None Imaging: None. He was given 1 L bolus of IV normal saline. He will be admitted to an observation medical telemetry bed for further evaluation and management    Assessment and Plan:  Altered mental status This like secondary to mild valproic acid toxicity. We will  monitor his mental status. S/p IVF given for hydration.  Continue oral hydration   Valproic acid toxicity - Depakote level 117--100-85--90 trended down Decreased Depakote 40 mg p.o. twice daily Discussed with psych, cleared for discharge to group home  Hyperammonia could be due to Depakote Ammonia level mildly elevated Started lactulose twice a day Titrate to 2-3 BM per day Check ammonia level tomorrow a.m.   OSA (obstructive sleep apnea) Will continue home CPAP nightly.   Intermittent explosive disorder - continue Haldol and lithium as well as psychotropic medications.    Cellulitis of great toe, right Started doxycycline 100 mg p.o. twice daily for 5 days Picture saved in the media  Essential hypertension -  continue his antihypertensive therapy. Monitor BP and titrate medication accordingly  Body mass index is 36.61 kg/m.  Interventions:  Diet: Heart healthy diet DVT Prophylaxis: Subcutaneous Lovenox   Advance goals of care discussion: Full code  Family Communication: family was not present at bedside, at the time of interview.  The pt provided permission to discuss medical plan with the family. Opportunity was given to ask question and all questions were answered satisfactorily.   Disposition:  Pt is from Group Home, was in the ED for 2 months, developed AMS and found to have elevated Depakote level so patient was admitted as an inpatient.  Depakote dose decreased, valproic acid level improved, slightly elevated ammonia level, started lactulose.  Medically stable, awaiting for insurance approval for 2:1 care and then discharge to group home.  Right toe  infection on oral antibiotics TOC following for disposition plan   Subjective: No significant events overnight, patient was laying comfortably, right toe pain is under control.  Patient is moving bowels, no any specific complaints.  Physical Exam: General: NAD, lying comfortably Appear in no distress, affect  appropriate Eyes: PERRLA ENT: Oral Mucosa Clear, moist  Neck: no JVD,  Cardiovascular: S1 and S2 Present, no Murmur,  Respiratory: good respiratory effort, Bilateral Air entry equal and Decreased, no Crackles, no wheezes Abdomen: Bowel Sound present, Soft and no tenderness,  Skin: no rashes Extremities: no Pedal edema, no calf tenderness, right great toe cellulitis around the nail bed Neurologic: without any new focal findings Gait not checked due to patient safety concerns  Vitals:   03/25/23 1529 03/25/23 2106 03/26/23 0421 03/26/23 0814  BP: 105/70 123/75 111/63 (!) 109/54  Pulse: 76 67 69 67  Resp: 19 18 18 18   Temp: 97.9 F (36.6 C) 97.9 F (36.6 C) 98 F (36.7 C) (!) 97.3 F (36.3 C)  TempSrc:  Oral Oral Oral  SpO2: 100% 97% 96% 91%  Weight:      Height:        Intake/Output Summary (Last 24 hours) at 03/26/2023 1320 Last data filed at 03/26/2023 1031 Gross per 24 hour  Intake 340 ml  Output --  Net 340 ml   Filed Weights   01/22/23 2000 03/25/23 0402  Weight: 99.8 kg 99.8 kg    Data Reviewed: I have personally reviewed and interpreted daily labs, tele strips, imagings as discussed above. I reviewed all nursing notes, pharmacy notes, vitals, pertinent old records I have discussed plan of care as described above with RN and patient/family.  CBC: Recent Labs  Lab 03/25/23 0050 03/25/23 0454 03/26/23 0608  WBC 9.9 8.4 7.9  NEUTROABS 6.8  --   --   HGB 15.2 14.0 14.3  HCT 45.1 42.7 43.0  MCV 92.4 94.9 93.5  PLT 193 155 175   Basic Metabolic Panel: Recent Labs  Lab 03/25/23 0050 03/25/23 0454 03/26/23 0608  NA 134* 135 137  K 4.3 4.4 4.0  CL 102 106 107  CO2 22 24 21*  GLUCOSE 101* 86 97  BUN 15 14 12   CREATININE 0.98 0.90 0.82  CALCIUM 9.5 8.9 9.4  MG  --   --  2.0  PHOS  --   --  4.9*    Studies: No results found.  Scheduled Meds:  azelastine  1 spray Each Nare BID   bacitracin   Topical BID   benztropine  1 mg Oral BID   clonazePAM   1 mg Oral BID   cloNIDine  0.1 mg Oral Daily   cloNIDine  0.2 mg Oral QHS   clotrimazole   Topical BID   divalproex  500 mg Oral Q12H   doxycycline  100 mg Oral Q12H   DULoxetine  30 mg Oral BID   enoxaparin (LOVENOX) injection  0.5 mg/kg Subcutaneous Q24H   haloperidol  10 mg Oral Daily   ipratropium  2 spray Nasal BID   lactulose  20 g Oral TID   lisinopril  20 mg Oral Daily   lithium carbonate  300 mg Oral BID WC   lurasidone  60 mg Oral Daily   melatonin  10 mg Oral QHS   mirabegron ER  25 mg Oral Daily   pantoprazole  40 mg Oral Daily   Continuous Infusions: PRN Meds: acetaminophen **OR** acetaminophen, albuterol, haloperidol, hydrOXYzine, magnesium hydroxide, ondansetron **OR** ondansetron (ZOFRAN)  IV, traZODone  Time spent: 35 minutes  Author: Gillis Santa. MD [facility] 03/26/2023 1:20 PM  To reach On-call, see care teams to locate the attending and reach out to them via [email or web address]. If 7PM-7AM, please contact night-coverage If you still have difficulty reaching the attending provider, please page the [facility] (Director on Call) for [facility] on amion for assistance.

## 2023-03-27 DIAGNOSIS — R4182 Altered mental status, unspecified: Secondary | ICD-10-CM | POA: Diagnosis not present

## 2023-03-27 LAB — AMMONIA: Ammonia: 37 umol/L — ABNORMAL HIGH (ref 9–35)

## 2023-03-27 MED ORDER — LACTULOSE 10 GM/15ML PO SOLN: 20.0000 g | Freq: Every day | ORAL | Status: DC

## 2023-03-27 MED ORDER — LURASIDONE HCL 40 MG PO TABS
60.0000 mg | ORAL_TABLET | Freq: Every day | ORAL | Status: DC
  Administered 2023-03-28 – 2023-04-01 (×5): 60 mg via ORAL
  Filled 2023-03-27 (×6): qty 1

## 2023-03-27 MED ORDER — LACTULOSE 10 GM/15ML PO SOLN
10.0000 g | Freq: Every day | ORAL | Status: DC
  Administered 2023-03-28 – 2023-04-02 (×4): 10 g via ORAL
  Filled 2023-03-27 (×5): qty 30

## 2023-03-27 MED ORDER — LACTULOSE 10 GM/15ML PO SOLN: 10.0000 g | Freq: Every day | ORAL | Status: DC

## 2023-03-27 NOTE — Progress Notes (Signed)
[facility] Progress Note  Patient: Louis Edwards    WUJ:[id number]  DOA: 01/22/2023     Date of Service: the patient was seen and examined on 03/27/2023  Chief Complaint  Patient presents with   Mental Health Problem   Brief hospital course: Louis Edwards is a 37 y.o. male with medical history significant for ADHD, ODD, and moderate correctional disability, who presented to the emergency room for psychiatry evaluation for aggression and homicidal ideations.  Per his guardian who is his mother the patient has tendency to go off on people with upset.  She reported patient being aggressive to staff members.  Patient denied any suicidal thoughts. His group home owner reported that he is a 2:1 for staff and had funding for 1:1 and cannot afford to have him return as he does not have staffing to meet his needs.  Psych cleared, TOC consult placed for resource assistance and possible placement.  Based on his low Lithium level and Depakote being on the lower end of normal, doses where adjusted along with his Risperdal to assist in his aggressiv tendencies.   His nurse reported tonight that he was dizzy with unsteady gait with psychomotor retardation, urinary incontinence and slurred speech with altered mental status.  No paresthesias or focal muscle weakness.  No tinnitus or vertigo.  No weakness seizures.  No chest pain or palpitations.  No cough or wheezing or dyspnea.  No dysuria, oliguria or hematuria or flank pain.   ED Course: Patient's vital signs were within normal.  Labs revealed mild hyponatremia 134" 101 with otherwise unremarkable CMP.  CBC was within normal.  UA showed no abnormalities. EKG as reviewed by me : None Imaging: None. He was given 1 L bolus of IV normal saline. He will be admitted to an observation medical telemetry bed for further evaluation and management    Assessment and Plan:  Altered mental status This like secondary to mild valproic acid toxicity. We will  monitor his mental status. S/p IVF given for hydration.  Continue oral hydration   Fever, unknown cause 6/21 Tmax 101 F/u Blood cultures Continue monitor vital signs  Valproic acid toxicity - Depakote level 117--100-85--90 trended down Decreased Depakote 40 mg p.o. twice daily Discussed with psych, cleared for discharge to group home  Hyperammonia could be due to Depakote Ammonia level 57 mildly elevated S/p lactulose BID, ammonia level 37, trended down Decreased lactulose 10 g p.o. daily   OSA (obstructive sleep apnea) Will continue home CPAP nightly.   Intermittent explosive disorder - continue Haldol and lithium as well as psychotropic medications.    Cellulitis of great toe, right Started doxycycline 100 mg p.o. twice daily for 5 days Picture saved in the media  Essential hypertension - Continue lisinopril 20 mg p.o. daily, clonidine 0.1 mg in a.m. and 0.2 mg in the p.m. 6/21 blood pressure soft, held medications today, holding parameters entered  Monitor BP and titrate medication accordingly  Body mass index is 36.61 kg/m.  Interventions:  Diet: Heart healthy diet DVT Prophylaxis: Subcutaneous Lovenox   Advance goals of care discussion: Full code  Family Communication: family was not present at bedside, at the time of interview.  The pt provided permission to discuss medical plan with the family. Opportunity was given to ask question and all questions were answered satisfactorily.   Disposition:  Pt is from Group Home, was in the ED for 2 months, developed AMS and found to have elevated Depakote level so patient was admitted  as an inpatient.  Depakote dose decreased, valproic acid level improved, slightly elevated ammonia level, started lactulose.  Medically stable, awaiting for insurance approval for 2:1 care and then discharge to group home.  Right toe infection on oral antibiotics TOC following for disposition plan   Subjective: No significant events  overnight, fever was charted 101.1 at midnight but patient was not aware, patient was sleepy, denies any complaints.   Physical Exam: General: NAD, lying comfortably Appear in no distress, affect appropriate Eyes: PERRLA ENT: Oral Mucosa Clear, moist  Neck: no JVD,  Cardiovascular: S1 and S2 Present, no Murmur,  Respiratory: good respiratory effort, Bilateral Air entry equal and Decreased, no Crackles, no wheezes Abdomen: Bowel Sound present, Soft and no tenderness,  Skin: no rashes Extremities: no Pedal edema, no calf tenderness, right great toe cellulitis around the nail bed Neurologic: without any new focal findings Gait not checked due to patient safety concerns  Vitals:   03/27/23 0503 03/27/23 0758 03/27/23 0802 03/27/23 1200  BP: (!) 101/55 98/63 118/87   Pulse: (!) 57 (!) 57 94   Resp: 16 15 20    Temp: 97.6 F (36.4 C) 98.2 F (36.8 C) (!) 101.1 F (38.4 C) 98.2 F (36.8 C)  TempSrc:  Oral Oral Oral  SpO2: 94% (!) 86% 91%   Weight:      Height:        Intake/Output Summary (Last 24 hours) at 03/27/2023 1326 Last data filed at 03/26/2023 1424 Gross per 24 hour  Intake 240 ml  Output --  Net 240 ml   Filed Weights   01/22/23 2000 03/25/23 0402  Weight: 99.8 kg 99.8 kg    Data Reviewed: I have personally reviewed and interpreted daily labs, tele strips, imagings as discussed above. I reviewed all nursing notes, pharmacy notes, vitals, pertinent old records I have discussed plan of care as described above with RN and patient/family.  CBC: Recent Labs  Lab 03/25/23 0050 03/25/23 0454 03/26/23 0608  WBC 9.9 8.4 7.9  NEUTROABS 6.8  --   --   HGB 15.2 14.0 14.3  HCT 45.1 42.7 43.0  MCV 92.4 94.9 93.5  PLT 193 155 175   Basic Metabolic Panel: Recent Labs  Lab 03/25/23 0050 03/25/23 0454 03/26/23 0608  NA 134* 135 137  K 4.3 4.4 4.0  CL 102 106 107  CO2 22 24 21*  GLUCOSE 101* 86 97  BUN 15 14 12   CREATININE 0.98 0.90 0.82  CALCIUM 9.5 8.9 9.4   MG  --   --  2.0  PHOS  --   --  4.9*    Studies: No results found.  Scheduled Meds:  azelastine  1 spray Each Nare BID   bacitracin   Topical BID   benztropine  1 mg Oral BID   clonazePAM  1 mg Oral BID   cloNIDine  0.1 mg Oral Daily   cloNIDine  0.2 mg Oral QHS   clotrimazole   Topical BID   divalproex  500 mg Oral Q12H   doxycycline  100 mg Oral Q12H   DULoxetine  30 mg Oral BID   enoxaparin (LOVENOX) injection  0.5 mg/kg Subcutaneous Q24H   haloperidol  10 mg Oral Daily   ipratropium  2 spray Nasal BID   lactulose  10 g Oral Daily   lisinopril  20 mg Oral Daily   lithium carbonate  300 mg Oral BID WC   [START ON 03/28/2023] lurasidone  60 mg Oral Q breakfast  melatonin  10 mg Oral QHS   mirabegron ER  25 mg Oral Daily   pantoprazole  40 mg Oral Daily   Continuous Infusions: PRN Meds: acetaminophen **OR** acetaminophen, albuterol, haloperidol, hydrOXYzine, magnesium hydroxide, ondansetron **OR** ondansetron (ZOFRAN) IV, traZODone  Time spent: 35 minutes  Author: Gillis Santa. MD [facility] 03/27/2023 1:26 PM  To reach On-call, see care teams to locate the attending and reach out to them via [email or web address]. If 7PM-7AM, please contact night-coverage If you still have difficulty reaching the attending provider, please page the [facility] (Director on Call) for [facility] on amion for assistance.

## 2023-03-27 NOTE — TOC Progression Note (Addendum)
Transition of Care [facility]) - Progression Note    Patient Details  Name: Louis Edwards MRN: [id number] Date of Birth: 12/07/1985  [facility] CM/SW Contact  Darleene Cleaver, [place] Phone Number: 03/27/2023, 1:41 PM  Clinical Narrative:     CSW contacted Dawn at [facility] [phone], they are still working on [facility] contract for placement for patient at [facility].  They have also sent referral to several other group homes in case [facility] is not able to accept patient.  CSW to continue to follow patient's progress throughout discharge planning.  3:20pm Patient has a legal guardian which is his mother.  [facility] is working on new placement for patient.  Per Dawn at [facility] is working on their [employer].  These group homes have also been given the referral for him as a backup plan: [facility]   [facility] supposed to be updating [facility] today if they can take him instead of [facility].  Expected Discharge Plan: Group Home Barriers to Discharge:  (placement)  Expected Discharge Plan and Services    New group home   Living arrangements for the past 2 months: Group Home                                       Social Determinants of Health (SDOH) Interventions SDOH Screenings   Food Insecurity: Patient Unable To Answer (03/25/2023)  Housing: Patient Unable To Answer (03/25/2023)  Transportation Needs: Patient Unable To Answer (03/25/2023)  Utilities: Patient Unable To Answer (03/25/2023)  Tobacco Use: Low Risk  (03/25/2023)    Readmission Risk Interventions     No data to display

## 2023-03-28 DIAGNOSIS — R4182 Altered mental status, unspecified: Secondary | ICD-10-CM | POA: Diagnosis not present

## 2023-03-28 LAB — CBC
HCT: 42.8 % (ref 39.0–52.0)
Hemoglobin: 14.6 g/dL (ref 13.0–17.0)
MCH: 31.3 pg (ref 26.0–34.0)
MCHC: 34.1 g/dL (ref 30.0–36.0)
MCV: 91.6 fL (ref 80.0–100.0)
Platelets: 173 10*3/uL (ref 150–400)
RBC: 4.67 MIL/uL (ref 4.22–5.81)
RDW: 12.3 % (ref 11.5–15.5)
WBC: 7.2 10*3/uL (ref 4.0–10.5)
nRBC: 0 % (ref 0.0–0.2)

## 2023-03-28 LAB — CULTURE, BLOOD (ROUTINE X 2): Culture: NO GROWTH

## 2023-03-28 NOTE — Progress Notes (Signed)
Mobility Specialist - Progress Note   03/28/23 1007  Mobility  Activity Ambulated independently in hallway;Stood at bedside;Dangled on edge of bed  Level of Assistance Independent  Assistive Device None  Distance Ambulated (ft) 340 ft  Activity Response Tolerated well  Mobility Referral Yes  $Mobility charge 1 Mobility  Mobility Specialist Start Time (ACUTE ONLY) 0931  Mobility Specialist Stop Time (ACUTE ONLY) 0943  Mobility Specialist Time Calculation (min) (ACUTE ONLY) 12 min   Pt supine in bed on RA upon arrival. Pt STS and ambulates two laps in hallway indep. Pt returns to bed with needs in reach.   Terrilyn Saver  Mobility Specialist  03/28/23 10:08 AM

## 2023-03-28 NOTE — Progress Notes (Signed)
[facility] Progress Note  Patient: Louis Edwards    ZOX:[id number]  DOA: 01/22/2023     Date of Service: the patient was seen and examined on 03/28/2023  Chief Complaint  Patient presents with   Mental Health Problem   Brief hospital course: Louis Edwards is a 37 y.o. male with medical history significant for ADHD, ODD, and moderate correctional disability, who presented to the emergency room for psychiatry evaluation for aggression and homicidal ideations.  Per his guardian who is his mother the patient has tendency to go off on people with upset.  She reported patient being aggressive to staff members.  Patient denied any suicidal thoughts. His group home owner reported that he is a 2:1 for staff and had funding for 1:1 and cannot afford to have him return as he does not have staffing to meet his needs.  Psych cleared, TOC consult placed for resource assistance and possible placement.  Based on his low Lithium level and Depakote being on the lower end of normal, doses where adjusted along with his Risperdal to assist in his aggressiv tendencies.   His nurse reported tonight that he was dizzy with unsteady gait with psychomotor retardation, urinary incontinence and slurred speech with altered mental status.  No paresthesias or focal muscle weakness.  No tinnitus or vertigo.  No weakness seizures.  No chest pain or palpitations.  No cough or wheezing or dyspnea.  No dysuria, oliguria or hematuria or flank pain.   ED Course: Patient's vital signs were within normal.  Labs revealed mild hyponatremia 134" 101 with otherwise unremarkable CMP.  CBC was within normal.  UA showed no abnormalities. EKG as reviewed by me : None Imaging: None. He was given 1 L bolus of IV normal saline. He will be admitted to an observation medical telemetry bed for further evaluation and management    Assessment and Plan:  Altered mental status This like secondary to mild valproic acid toxicity. We will  monitor his mental status. S/p IVF given for hydration.  Continue oral hydration   Fever, on 6/21, unknown cause 6/21 Tmax 101  Blood cultures NGTD Continue monitor vital signs  Valproic acid toxicity - Depakote level 117--100-85--90 trended down Decreased Depakote 40 mg p.o. twice daily Discussed with psych, cleared for discharge to group home  Hyperammonia could be due to Depakote Ammonia level 57 mildly elevated S/p lactulose BID, ammonia level 37, trended down Decreased lactulose 10 g p.o. daily   OSA (obstructive sleep apnea) Will continue home CPAP nightly.   Intermittent explosive disorder - continue Haldol and lithium as well as psychotropic medications.    Cellulitis of great toe, right 6/19 Started doxycycline 100 mg p.o. twice daily for 5 days Picture saved in the media  Essential hypertension - Continue lisinopril 20 mg p.o. daily, clonidine 0.1 mg in a.m. and 0.2 mg in the p.m. 6/21 blood pressure soft, held medications today, holding parameters entered  Monitor BP and titrate medication accordingly  Body mass index is 36.61 kg/m.  Interventions:  Diet: Heart healthy diet DVT Prophylaxis: Subcutaneous Lovenox   Advance goals of care discussion: Full code  Family Communication: family was not present at bedside, at the time of interview.  The pt provided permission to discuss medical plan with the family. Opportunity was given to ask question and all questions were answered satisfactorily.   Disposition:  Pt is from Group Home, was in the ED for 2 months, developed AMS and found to have elevated Depakote level  so patient was admitted as an inpatient.  Depakote dose decreased, valproic acid level improved, slightly elevated ammonia level, started lactulose.  Medically stable, awaiting for insurance approval for 2:1 care and then discharge to group home.  Right toe infection on oral antibiotics TOC following for disposition plan   Subjective: No  significant events overnight, patient was laying comfortably, he was sleepy but woke up.  Patient stated that he ate breakfast early morning.  Denies any complaints, no pain.   Physical Exam: General: NAD, lying comfortably Appear in no distress, affect appropriate Eyes: PERRLA ENT: Oral Mucosa Clear, moist  Neck: no JVD,  Cardiovascular: S1 and S2 Present, no Murmur,  Respiratory: good respiratory effort, Bilateral Air entry equal and Decreased, no Crackles, no wheezes Abdomen: Bowel Sound present, Soft and no tenderness,  Skin: no rashes Extremities: no Pedal edema, no calf tenderness, right great toe cellulitis around the nail bed, gradually improving Neurologic: without any new focal findings Gait not checked due to patient safety concerns  Vitals:   03/27/23 1553 03/27/23 2100 03/28/23 0459 03/28/23 0905  BP: 121/78 (!) 133/93 120/86 119/81  Pulse: 84 91 72 64  Resp: 18 16 18 18   Temp: 97.6 F (36.4 C) 99 F (37.2 C) 97.6 F (36.4 C) 98.2 F (36.8 C)  TempSrc: Oral Oral    SpO2: 94% 95% 96% 94%  Weight:      Height:       No intake or output data in the 24 hours ending 03/28/23 1412  Filed Weights   01/22/23 2000 03/25/23 0402  Weight: 99.8 kg 99.8 kg    Data Reviewed: I have personally reviewed and interpreted daily labs, tele strips, imagings as discussed above. I reviewed all nursing notes, pharmacy notes, vitals, pertinent old records I have discussed plan of care as described above with RN and patient/family.  CBC: Recent Labs  Lab 03/25/23 0050 03/25/23 0454 03/26/23 0608 03/28/23 0504  WBC 9.9 8.4 7.9 7.2  NEUTROABS 6.8  --   --   --   HGB 15.2 14.0 14.3 14.6  HCT 45.1 42.7 43.0 42.8  MCV 92.4 94.9 93.5 91.6  PLT 193 155 175 173   Basic Metabolic Panel: Recent Labs  Lab 03/25/23 0050 03/25/23 0454 03/26/23 0608  NA 134* 135 137  K 4.3 4.4 4.0  CL 102 106 107  CO2 22 24 21*  GLUCOSE 101* 86 97  BUN 15 14 12   CREATININE 0.98 0.90 0.82   CALCIUM 9.5 8.9 9.4  MG  --   --  2.0  PHOS  --   --  4.9*    Studies: No results found.  Scheduled Meds:  azelastine  1 spray Each Nare BID   bacitracin   Topical BID   benztropine  1 mg Oral BID   clonazePAM  1 mg Oral BID   cloNIDine  0.1 mg Oral Daily   cloNIDine  0.2 mg Oral QHS   clotrimazole   Topical BID   divalproex  500 mg Oral Q12H   doxycycline  100 mg Oral Q12H   DULoxetine  30 mg Oral BID   enoxaparin (LOVENOX) injection  0.5 mg/kg Subcutaneous Q24H   haloperidol  10 mg Oral Daily   ipratropium  2 spray Nasal BID   lactulose  10 g Oral Daily   lisinopril  20 mg Oral Daily   lithium carbonate  300 mg Oral BID WC   lurasidone  60 mg Oral Q breakfast   melatonin  10 mg Oral QHS   mirabegron ER  25 mg Oral Daily   pantoprazole  40 mg Oral Daily   Continuous Infusions: PRN Meds: acetaminophen **OR** acetaminophen, albuterol, haloperidol, hydrOXYzine, magnesium hydroxide, ondansetron **OR** ondansetron (ZOFRAN) IV, traZODone  Time spent: 35 minutes  Author: Gillis Santa. MD [facility] 03/28/2023 2:12 PM  To reach On-call, see care teams to locate the attending and reach out to them via [email or web address]. If 7PM-7AM, please contact night-coverage If you still have difficulty reaching the attending provider, please page the [facility] (Director on Call) for [facility] on amion for assistance.

## 2023-03-29 DIAGNOSIS — R4182 Altered mental status, unspecified: Secondary | ICD-10-CM | POA: Diagnosis not present

## 2023-03-29 LAB — CULTURE, BLOOD (ROUTINE X 2): Special Requests: ADEQUATE

## 2023-03-29 MED ORDER — DOXYCYCLINE HYCLATE 100 MG PO TABS
100.0000 mg | ORAL_TABLET | Freq: Two times a day (BID) | ORAL | Status: AC
  Administered 2023-03-29 – 2023-03-31 (×5): 100 mg via ORAL
  Filled 2023-03-29 (×5): qty 1

## 2023-03-29 NOTE — Progress Notes (Signed)
[facility] Progress Note  Patient: Louis Edwards    ZOX:[id number]  DOA: 01/22/2023     Date of Service: the patient was seen and examined on 03/29/2023  Chief Complaint  Patient presents with   Mental Health Problem   Brief hospital course: Louis Edwards is a 37 y.o. male with medical history significant for ADHD, ODD, and moderate correctional disability, who presented to the emergency room for psychiatry evaluation for aggression and homicidal ideations.  Per his guardian who is his mother the patient has tendency to go off on people with upset.  She reported patient being aggressive to staff members.  Patient denied any suicidal thoughts. His group home owner reported that he is a 2:1 for staff and had funding for 1:1 and cannot afford to have him return as he does not have staffing to meet his needs.  Psych cleared, TOC consult placed for resource assistance and possible placement.  Based on his low Lithium level and Depakote being on the lower end of normal, doses where adjusted along with his Risperdal to assist in his aggressiv tendencies.   His nurse reported tonight that he was dizzy with unsteady gait with psychomotor retardation, urinary incontinence and slurred speech with altered mental status.  No paresthesias or focal muscle weakness.  No tinnitus or vertigo.  No weakness seizures.  No chest pain or palpitations.  No cough or wheezing or dyspnea.  No dysuria, oliguria or hematuria or flank pain.   ED Course: Patient's vital signs were within normal.  Labs revealed mild hyponatremia 134" 101 with otherwise unremarkable CMP.  CBC was within normal.  UA showed no abnormalities. EKG as reviewed by me : None Imaging: None. He was given 1 L bolus of IV normal saline. He will be admitted to an observation medical telemetry bed for further evaluation and management    Assessment and Plan:  Altered mental status This like secondary to mild valproic acid toxicity. We will  monitor his mental status. S/p IVF given for hydration.  Continue oral hydration   Fever, on 6/21, unknown cause 6/21 Tmax 101  Blood cultures NGTD Continue monitor vital signs  Valproic acid toxicity - Depakote level 117--100-85--90 trended down Decreased Depakote 40 mg p.o. twice daily Discussed with psych, cleared for discharge to group home  Hyperammonia could be due to Depakote Ammonia level 57 mildly elevated S/p lactulose BID, ammonia level 37, trended down Decreased lactulose 10 g p.o. daily   OSA (obstructive sleep apnea) Will continue home CPAP nightly.   Intermittent explosive disorder - continue Haldol and lithium as well as psychotropic medications.    Cellulitis of great toe, right 6/19 Started doxycycline 100 mg p.o. twice daily for 7 days Picture saved in the media section  Essential hypertension - Continue lisinopril 20 mg p.o. daily, clonidine 0.1 mg in a.m. and 0.2 mg in the p.m. 6/21 blood pressure soft, held medications today, holding parameters entered  Monitor BP and titrate medication accordingly  Body mass index is 36.61 kg/m.  Interventions:  Diet: Heart healthy diet DVT Prophylaxis: Subcutaneous Lovenox   Advance goals of care discussion: Full code  Family Communication: family was not present at bedside, at the time of interview.  The pt provided permission to discuss medical plan with the family. Opportunity was given to ask question and all questions were answered satisfactorily.   Disposition:  Pt is from Group Home, was in the ED for 2 months, developed AMS and found to have elevated Depakote  level so patient was admitted as an inpatient.  Depakote dose decreased, valproic acid level improved, slightly elevated ammonia level, started lactulose.  Medically stable, awaiting for insurance approval for 2:1 care and then discharge to group home.  Right toe infection on oral antibiotics TOC following for disposition plan   Subjective: No  significant events overnight, patient was awake and alert, stated that he would like to go down in the ED section back, he is feeling fine.  Patient was advised that he cannot go back to the ED as he is admitted as an inpatient now.  Patient denied any pain in the right toe, feels improvement, no any other active issues.    Physical Exam: General: NAD, lying comfortably Appear in no distress, affect appropriate Eyes: PERRLA ENT: Oral Mucosa Clear, moist  Neck: no JVD,  Cardiovascular: S1 and S2 Present, no Murmur,  Respiratory: good respiratory effort, Bilateral Air entry equal and Decreased, no Crackles, no wheezes Abdomen: Bowel Sound present, Soft and no tenderness,  Skin: no rashes Extremities: no Pedal edema, no calf tenderness, right great toe cellulitis around the nail bed, gradually improving Neurologic: without any new focal findings Gait not checked due to patient safety concerns  Vitals:   03/28/23 0905 03/28/23 2005 03/29/23 0513 03/29/23 0859  BP: 119/81 110/77 108/67 119/67  Pulse: 64 98 71 64  Resp: 18 20 20 17   Temp: 98.2 F (36.8 C) 98.4 F (36.9 C) 98.5 F (36.9 C)   TempSrc:      SpO2: 94% 94% 93% 93%  Weight:      Height:       No intake or output data in the 24 hours ending 03/29/23 1222  Filed Weights   01/22/23 2000 03/25/23 0402  Weight: 99.8 kg 99.8 kg    Data Reviewed: I have personally reviewed and interpreted daily labs, tele strips, imagings as discussed above. I reviewed all nursing notes, pharmacy notes, vitals, pertinent old records I have discussed plan of care as described above with RN and patient/family.  CBC: Recent Labs  Lab 03/25/23 0050 03/25/23 0454 03/26/23 0608 03/28/23 0504  WBC 9.9 8.4 7.9 7.2  NEUTROABS 6.8  --   --   --   HGB 15.2 14.0 14.3 14.6  HCT 45.1 42.7 43.0 42.8  MCV 92.4 94.9 93.5 91.6  PLT 193 155 175 173   Basic Metabolic Panel: Recent Labs  Lab 03/25/23 0050 03/25/23 0454 03/26/23 0608  NA 134*  135 137  K 4.3 4.4 4.0  CL 102 106 107  CO2 22 24 21*  GLUCOSE 101* 86 97  BUN 15 14 12   CREATININE 0.98 0.90 0.82  CALCIUM 9.5 8.9 9.4  MG  --   --  2.0  PHOS  --   --  4.9*    Studies: No results found.  Scheduled Meds:  azelastine  1 spray Each Nare BID   bacitracin   Topical BID   benztropine  1 mg Oral BID   clonazePAM  1 mg Oral BID   cloNIDine  0.1 mg Oral Daily   cloNIDine  0.2 mg Oral QHS   clotrimazole   Topical BID   divalproex  500 mg Oral Q12H   doxycycline  100 mg Oral Q12H   DULoxetine  30 mg Oral BID   enoxaparin (LOVENOX) injection  0.5 mg/kg Subcutaneous Q24H   haloperidol  10 mg Oral Daily   ipratropium  2 spray Nasal BID   lactulose  10 g Oral  Daily   lisinopril  20 mg Oral Daily   lithium carbonate  300 mg Oral BID WC   lurasidone  60 mg Oral Q breakfast   melatonin  10 mg Oral QHS   mirabegron ER  25 mg Oral Daily   pantoprazole  40 mg Oral Daily   Continuous Infusions: PRN Meds: acetaminophen **OR** acetaminophen, albuterol, haloperidol, hydrOXYzine, magnesium hydroxide, ondansetron **OR** ondansetron (ZOFRAN) IV, traZODone  Time spent: 35 minutes  Author: Gillis Santa. MD [facility] 03/29/2023 12:22 PM  To reach On-call, see care teams to locate the attending and reach out to them via [email or web address]. If 7PM-7AM, please contact night-coverage If you still have difficulty reaching the attending provider, please page the [facility] (Director on Call) for [facility] on amion for assistance.

## 2023-03-29 NOTE — Progress Notes (Signed)
Mobility Specialist - Progress Note   03/29/23 0947  Mobility  Activity Ambulated independently in hallway;Stood at bedside;Dangled on edge of bed  Level of Assistance Independent  Assistive Device None  Distance Ambulated (ft) 480 ft  Activity Response Tolerated well  Mobility Referral Yes  $Mobility charge 1 Mobility  Mobility Specialist Start Time (ACUTE ONLY) 0849  Mobility Specialist Stop Time (ACUTE ONLY) 0908  Mobility Specialist Time Calculation (min) (ACUTE ONLY) 19 min   Pt supine in bed on RA upon arrival. Pt STS and ambulates 3 laps around NS indep with no LOB noted. Pt returns to bed with needs in reach.   Terrilyn Saver  Mobility Specialist  03/29/23 9:50 AM

## 2023-03-30 DIAGNOSIS — R4182 Altered mental status, unspecified: Secondary | ICD-10-CM | POA: Diagnosis not present

## 2023-03-30 DIAGNOSIS — R4689 Other symptoms and signs involving appearance and behavior: Secondary | ICD-10-CM | POA: Diagnosis not present

## 2023-03-30 LAB — CULTURE, BLOOD (ROUTINE X 2)
Culture: NO GROWTH
Special Requests: ADEQUATE

## 2023-03-30 NOTE — Progress Notes (Signed)
Patient just wrote me a note saying: : "To Psych and [name]. I feel like that I am going to hurt my mom really bad again because she denied talking to me on the phone. I wish I was dead. I feel like I'm going to kill myself and harm myself"   When patient questioned about whether or not he wrote the note to try to get to [name], patient stated "yes" and smiled.   Patient reminded that making stuff up in order to get what we want isn't the correct way to do things. Patient immediately stated, "I'm sorry."  MD, psych coverage, charge nurse, and [facility] all made aware. RN to remain at bedside until clearance received from psych or sitter available.

## 2023-03-30 NOTE — Care Management Important Message (Signed)
Important Message  Patient Details  Name: Louis Edwards MRN: [id number] Date of Birth: 08-21-1986   Medicare Important Message Given:  N/A - LOS <3 / Initial given by admissions     Olegario Messier A Helane Briceno 03/30/2023, 2:05 PM

## 2023-03-30 NOTE — Progress Notes (Signed)
RN at bedside with patient, awaiting psych eval. Patient now adamantly stated he just "made it up" when referring to his comments about hurting his mother and himself. Pt states he "made a mistake" and "shouldn't do wrong things."

## 2023-03-30 NOTE — Progress Notes (Signed)
[facility] Progress Note  Patient: Louis Edwards    WUJ:[id number]  DOA: 01/22/2023     Date of Service: the patient was seen and examined on 03/30/2023  Chief Complaint  Patient presents with   Mental Health Problem   Brief hospital course: BRAHM BARBEAU is a 37 y.o. male with medical history significant for ADHD, ODD, and moderate correctional disability, who presented to the emergency room for psychiatry evaluation for aggression and homicidal ideations.  Per his guardian who is his mother the patient has tendency to go off on people with upset.  She reported patient being aggressive to staff members.  Patient denied any suicidal thoughts. His group home owner reported that he is a 2:1 for staff and had funding for 1:1 and cannot afford to have him return as he does not have staffing to meet his needs.  Psych cleared, TOC consult placed for resource assistance and possible placement.  Based on his low Lithium level and Depakote being on the lower end of normal, doses where adjusted along with his Risperdal to assist in his aggressiv tendencies.   His nurse reported tonight that he was dizzy with unsteady gait with psychomotor retardation, urinary incontinence and slurred speech with altered mental status.  No paresthesias or focal muscle weakness.  No tinnitus or vertigo.  No weakness seizures.  No chest pain or palpitations.  No cough or wheezing or dyspnea.  No dysuria, oliguria or hematuria or flank pain.   ED Course: Patient's vital signs were within normal.  Labs revealed mild hyponatremia 134" 101 with otherwise unremarkable CMP.  CBC was within normal.  UA showed no abnormalities. EKG as reviewed by me : None Imaging: None. He was given 1 L bolus of IV normal saline. He will be admitted to an observation medical telemetry bed for further evaluation and management    Assessment and Plan:  Altered mental status This like secondary to mild valproic acid toxicity. We will  monitor his mental status. S/p IVF given for hydration.  Continue oral hydration   Fever, on 6/21, unknown cause 6/21 Tmax 101  Blood cultures NGTD Continue monitor vital signs  Valproic acid toxicity - Depakote level 117--100-85--90 trended down Decreased Depakote 40 mg p.o. twice daily Discussed with psych, cleared for discharge to group home  Hyperammonia could be due to Depakote Ammonia level 57 mildly elevated S/p lactulose BID, ammonia level 37, trended down Decreased lactulose 10 g p.o. daily   OSA (obstructive sleep apnea) Will continue home CPAP nightly.   Intermittent explosive disorder - continue Haldol and lithium as well as psychotropic medications.    Cellulitis of great toe, right 6/19 Started doxycycline 100 mg p.o. twice daily for 7 days Picture saved in the media section  Essential hypertension - Continue lisinopril 20 mg p.o. daily, clonidine 0.1 mg in a.m. and 0.2 mg in the p.m. 6/21 blood pressure soft, held medications today, holding parameters entered  Monitor BP and titrate medication accordingly  Body mass index is 36.61 kg/m.  Interventions:  Diet: Heart healthy diet DVT Prophylaxis: Subcutaneous Lovenox   Advance goals of care discussion: Full code  Family Communication: family was not present at bedside, at the time of interview.  The pt provided permission to discuss medical plan with the family. Opportunity was given to ask question and all questions were answered satisfactorily.   Disposition:  Pt is from Group Home, was in the ED for 2 months, developed AMS and found to have elevated Depakote  level so patient was admitted as an inpatient.  Depakote dose decreased, valproic acid level improved, slightly elevated ammonia level, started lactulose.  Medically stable, awaiting for insurance approval for 2:1 care and then discharge to group home.  Right toe infection on oral antibiotics TOC following for disposition plan   Subjective: No  significant events overnight, patient was sitting comfortably in the chair and writing the letter/psych.  Right toe cellulitis is getting better, denies any pain.  No any other active issues.   Physical Exam: General: NAD, lying comfortably Appear in no distress, affect appropriate Eyes: PERRLA ENT: Oral Mucosa Clear, moist  Neck: no JVD,  Cardiovascular: S1 and S2 Present, no Murmur,  Respiratory: good respiratory effort, Bilateral Air entry equal and Decreased, no Crackles, no wheezes Abdomen: Bowel Sound present, Soft and no tenderness,  Skin: no rashes Extremities: no Pedal edema, no calf tenderness, right great toe cellulitis around the nail bed, gradually improving Neurologic: without any new focal findings Gait not checked due to patient safety concerns  Vitals:   03/29/23 1952 03/30/23 0511 03/30/23 0821 03/30/23 1146  BP: 107/76 (!) 97/57 120/82 120/86  Pulse: (!) 104 69 77 86  Resp: 18 16 16 16   Temp: 98.6 F (37 C) 98.9 F (37.2 C) 98.6 F (37 C) 98.4 F (36.9 C)  TempSrc:      SpO2: 94% 93% 94% 97%  Weight:      Height:       No intake or output data in the 24 hours ending 03/30/23 1449  Filed Weights   01/22/23 2000 03/25/23 0402  Weight: 99.8 kg 99.8 kg    Data Reviewed: I have personally reviewed and interpreted daily labs, tele strips, imagings as discussed above. I reviewed all nursing notes, pharmacy notes, vitals, pertinent old records I have discussed plan of care as described above with RN and patient/family.  CBC: Recent Labs  Lab 03/25/23 0050 03/25/23 0454 03/26/23 0608 03/28/23 0504  WBC 9.9 8.4 7.9 7.2  NEUTROABS 6.8  --   --   --   HGB 15.2 14.0 14.3 14.6  HCT 45.1 42.7 43.0 42.8  MCV 92.4 94.9 93.5 91.6  PLT 193 155 175 173   Basic Metabolic Panel: Recent Labs  Lab 03/25/23 0050 03/25/23 0454 03/26/23 0608  NA 134* 135 137  K 4.3 4.4 4.0  CL 102 106 107  CO2 22 24 21*  GLUCOSE 101* 86 97  BUN 15 14 12   CREATININE 0.98  0.90 0.82  CALCIUM 9.5 8.9 9.4  MG  --   --  2.0  PHOS  --   --  4.9*    Studies: No results found.  Scheduled Meds:  azelastine  1 spray Each Nare BID   bacitracin   Topical BID   benztropine  1 mg Oral BID   clonazePAM  1 mg Oral BID   cloNIDine  0.1 mg Oral Daily   cloNIDine  0.2 mg Oral QHS   clotrimazole   Topical BID   divalproex  500 mg Oral Q12H   doxycycline  100 mg Oral Q12H   DULoxetine  30 mg Oral BID   enoxaparin (LOVENOX) injection  0.5 mg/kg Subcutaneous Q24H   haloperidol  10 mg Oral Daily   ipratropium  2 spray Nasal BID   lactulose  10 g Oral Daily   lithium carbonate  300 mg Oral BID WC   lurasidone  60 mg Oral Q breakfast   melatonin  10 mg Oral  QHS   mirabegron ER  25 mg Oral Daily   pantoprazole  40 mg Oral Daily   Continuous Infusions: PRN Meds: acetaminophen **OR** acetaminophen, albuterol, haloperidol, hydrOXYzine, magnesium hydroxide, ondansetron **OR** ondansetron (ZOFRAN) IV, traZODone  Time spent: 35 minutes  Author: Gillis Santa. MD [facility] 03/30/2023 2:49 PM  To reach On-call, see care teams to locate the attending and reach out to them via [email or web address]. If 7PM-7AM, please contact night-coverage If you still have difficulty reaching the attending provider, please page the [facility] (Director on Call) for [facility] on amion for assistance.

## 2023-03-30 NOTE — Progress Notes (Signed)
Patient cleared by inpatient psych services. 1:1 sitter released.
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
## 2023-03-31 DIAGNOSIS — R4182 Altered mental status, unspecified: Secondary | ICD-10-CM | POA: Diagnosis not present

## 2023-03-31 LAB — BASIC METABOLIC PANEL
Anion gap: 8 (ref 5–15)
BUN: 15 mg/dL (ref 6–20)
CO2: 22 mmol/L (ref 22–32)
Calcium: 9.6 mg/dL (ref 8.9–10.3)
Chloride: 105 mmol/L (ref 98–111)
Creatinine, Ser: 0.93 mg/dL (ref 0.61–1.24)
GFR, Estimated: >60 mL/min (ref >60)
Glucose, Bld: 96 mg/dL (ref 70–99)
Potassium: 3.9 mmol/L (ref 3.5–5.1)
Sodium: 135 mmol/L (ref 135–145)

## 2023-03-31 LAB — CBC
HCT: 43.4 % (ref 39.0–52.0)
Hemoglobin: 14.4 g/dL (ref 13.0–17.0)
MCH: 30.9 pg (ref 26.0–34.0)
MCHC: 33.2 g/dL (ref 30.0–36.0)
MCV: 93.1 fL (ref 80.0–100.0)
Platelets: 180 10*3/uL (ref 150–400)
RBC: 4.66 MIL/uL (ref 4.22–5.81)
RDW: 12.8 % (ref 11.5–15.5)
WBC: 8.7 10*3/uL (ref 4.0–10.5)
nRBC: 0 % (ref 0.0–0.2)

## 2023-03-31 LAB — PHOSPHORUS: Phosphorus: 4.7 mg/dL — ABNORMAL HIGH (ref 2.5–4.6)

## 2023-03-31 LAB — VALPROIC ACID LEVEL: Valproic Acid Lvl: 61 ug/mL (ref 50.0–100.0)

## 2023-03-31 LAB — MAGNESIUM: Magnesium: 1.9 mg/dL (ref 1.7–2.4)

## 2023-03-31 LAB — AMMONIA: Ammonia: 34 umol/L (ref 9–35)

## 2023-03-31 NOTE — TOC Progression Note (Signed)
Transition of Care [facility]) - Progression Note    Patient Details  Name: Louis Edwards MRN: [id number] Date of Birth: 01/03/1986  Transition of [facility]) CM/SW Contact  Darleene Cleaver, [place] Phone Number: 03/31/2023, 11:38 AM  Clinical Narrative:     CSW attempted to contact Dawn at [facility] care, to get an updated on patient's placement.  CSW left a message awaiting for call back from [facility].   Expected Discharge Plan: Group Home Barriers to Discharge:  (placement)  Expected Discharge Plan and Services       Living arrangements for the past 2 months: Group Home                                       Social Determinants of Health (SDOH) Interventions SDOH Screenings   Food Insecurity: Patient Unable To Answer (03/25/2023)  Housing: Patient Unable To Answer (03/25/2023)  Transportation Needs: Patient Unable To Answer (03/25/2023)  Utilities: Patient Unable To Answer (03/25/2023)  Tobacco Use: Low Risk  (03/25/2023)    Readmission Risk Interventions     No data to display
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
## 2023-03-31 NOTE — Care Management Important Message (Signed)
Important Message  Patient Details  Name: Louis Edwards MRN: [id number] Date of Birth: [birth year]   Medicare Important Message Given:  N/A - LOS <3 / Initial given by admissions     Olegario Messier A Adaley Kiene 03/31/2023, 9:01 AM

## 2023-04-01 DIAGNOSIS — R4182 Altered mental status, unspecified: Secondary | ICD-10-CM | POA: Diagnosis not present

## 2023-04-01 LAB — CULTURE, BLOOD (ROUTINE X 2)
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
## 2023-04-01 NOTE — Progress Notes (Signed)
Mobility Specialist - Progress Note   04/01/23 0930  Mobility  Activity Ambulated independently in hallway  Level of Assistance Independent  Assistive Device None  Distance Ambulated (ft) 340 ft  Activity Response Tolerated well  Mobility Referral Yes  $Mobility charge 1 Mobility  Mobility Specialist Start Time (ACUTE ONLY) 0827  Mobility Specialist Stop Time (ACUTE ONLY) 0840  Mobility Specialist Time Calculation (min) (ACUTE ONLY) 13 min   Pt OOB in doorway on RA upon arrival. Pt ambulates in hallway indep with no LOB noted. Pt returns to room with RN present and needs within reach.   Terrilyn Saver  Mobility Specialist  04/01/23 9:31 AM

## 2023-04-01 NOTE — Care Management Important Message (Signed)
Important Message  Patient Details  Name: Louis Edwards MRN: [id number] Date of Birth: 07/26/1986   Medicare Important Message Given:  N/A - LOS <3 / Initial given by admissions     Olegario Messier A Mareta Chesnut 04/01/2023, 8:20 AM

## 2023-04-02 DIAGNOSIS — T426X1A Poisoning by other antiepileptic and sedative-hypnotic drugs, accidental (unintentional), initial encounter: Secondary | ICD-10-CM | POA: Diagnosis not present

## 2023-04-02 MED ORDER — HALOPERIDOL LACTATE 5 MG/ML IJ SOLN: 5.0000 mg | Freq: Four times a day (QID) | INTRAMUSCULAR | Status: DC | PRN

## 2023-04-02 MED ORDER — LURASIDONE HCL 40 MG PO TABS
60.0000 mg | ORAL_TABLET | Freq: Every day | ORAL | Status: DC
  Administered 2023-04-02 – 2023-04-03 (×2): 60 mg via ORAL
  Filled 2023-04-02 (×2): qty 1

## 2023-04-02 MED ORDER — LORAZEPAM 2 MG/ML IJ SOLN: 2.0000 mg | Freq: Four times a day (QID) | INTRAMUSCULAR | Status: DC | PRN
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
## 2023-04-02 NOTE — Progress Notes (Signed)
PROGRESS NOTE    Louis Edwards [facility]   NUU:[id number] DOB: 05/02/1986  DOA: 01/22/2023 Date of Service: 04/02/23 PCP: Anselm Jungling, NP     Brief Narrative / Hospital Course:  Louis Edwards is a 37 y.o. male with medical history significant for ADHD, ODD, and moderate developmental disability, who presented to the emergency room for psychiatry evaluation for aggression and homicidal ideations.  Per his guardian who is his mother the patient has tendency to go off on people with upset.  She reported patient being aggressive to staff members.  Patient denied any suicidal thoughts. His group home owner reported that he is a 2:1 for staff and had funding for 1:1 and cannot afford to have him return as he does not have staffing to meet his needs.  Psych consulted here, Dx intermittent explosive d/o and valproic acid tox, TOC consult placed for resource assistance and possible placement. Based on his low Lithium level and Depakote being on the lower end of normal, doses where adjusted along with his Risperdal to assist in his aggressiv tendencies. Pending insurance approval for placement   Consultants:  Psychiatry   Procedures: None       ASSESSMENT & PLAN:   Principal Problem:   Altered mental status Active Problems:   Valproic acid toxicity   OSA (obstructive sleep apnea)   Intermittent explosive disorder   Essential hypertension   Paronychia of great toe, right   Abnormal behavior   Altered mental status likely secondary to mild valproic acid toxicity. monitor his mental status. S/p IVF given for hydration. Continue oral hydration   Fever, on 6/21, unknown cause 6/21 Tmax 101 Blood cultures NGTD Continue monitor vital signs   Valproic acid toxicity Depakote level 117--100-85--90---61 trended down Decreased Depakote 40 mg p.o. twice daily Discussed with psych, cleared for discharge to group home   Hyperammonia could be due to Depakote Ammonia level 57 mildly  elevated S/p lactulose BID, ammonia level 37--34, trended down Decreased lactulose 10 g p.o. daily    OSA (obstructive sleep apnea) Will continue home CPAP nightly.   Intermittent explosive disorder continue Haldol and lithium as well as psychotropic medications.   Cellulitis of great toe, right 6/19 Started doxycycline 100 mg p.o. twice daily for 7 days Picture saved in the media section   Essential hypertension s/p lisinopril 20 mg p.o. daily, on 6/24 discontinued lisinopril due to low blood pressure Continued clonidine 0.1 mg in a.m. and 0.2 mg in the p.m. with holding parameters Monitor BP and titrate medication accordingly      DVT prophylaxis: lovenox  Pertinent IV fluids/nutrition: no continuous IV fluids  Central lines / invasive devices: none  Code Status: FULL CODE ACP documentation reviewed: none on file  Current Admission Status: inpatietn  TOC needs / Dispo plan: placement Barriers to discharge / significant pending items: placement              Subjective / Brief ROS:  Patient reports discomfort in his R great toe but otherwise ok.  Denies CP/SOB.  Pain controlled.  Tolerating diet.   Family Communication: none at this time     Objective Findings:  Vitals:   04/01/23 1608 04/01/23 2014 04/02/23 0732 04/02/23 0740  BP: (!) 133/101 (!) 140/97 119/79 (!) 95/49  Pulse:  89 69 91  Resp: 16 18 18 16   Temp:  98.9 F (37.2 C) 97.9 F (36.6 C) 99.8 F (37.7 C)  TempSrc:   Oral Oral  SpO2: 97% 96% 95% 96%  Weight:      Height:       No intake or output data in the 24 hours ending 04/02/23 1453 Filed Weights   01/22/23 2000 03/25/23 0402  Weight: 99.8 kg 99.8 kg    Examination:  Physical Exam Constitutional:      General: He is not in acute distress. Cardiovascular:     Rate and Rhythm: Normal rate and regular rhythm.  Pulmonary:     Effort: Pulmonary effort is normal.     Breath sounds: Normal breath sounds.  Abdominal:      Palpations: Abdomen is soft.  Musculoskeletal:     Right lower leg: No edema.     Left lower leg: No edema.  Skin:    General: Skin is warm and dry.     Comments: R great toe (+)erythema at medial nail  Neurological:     Mental Status: He is alert. Mental status is at baseline.  Psychiatric:        Mood and Affect: Mood normal.          Scheduled Medications:   azelastine  1 spray Each Nare BID   bacitracin   Topical BID   benztropine  1 mg Oral BID   clonazePAM  1 mg Oral BID   cloNIDine  0.1 mg Oral Daily   cloNIDine  0.2 mg Oral QHS   clotrimazole   Topical BID   divalproex  500 mg Oral Q12H   DULoxetine  30 mg Oral BID   enoxaparin (LOVENOX) injection  0.5 mg/kg Subcutaneous Q24H   haloperidol  10 mg Oral Daily   ipratropium  2 spray Nasal BID   lactulose  10 g Oral Daily   lithium carbonate  300 mg Oral BID WC   lurasidone  60 mg Oral Q breakfast   melatonin  10 mg Oral QHS   mirabegron ER  25 mg Oral Daily   pantoprazole  40 mg Oral Daily    Continuous Infusions:   PRN Medications:  acetaminophen **OR** acetaminophen, albuterol, haloperidol, hydrOXYzine, magnesium hydroxide, ondansetron **OR** ondansetron (ZOFRAN) IV, traZODone  Antimicrobials from admission:  Anti-infectives (From admission, onward)    Start     Dose/Rate Route Frequency Ordered Stop   03/29/23 2200  doxycycline (VIBRA-TABS) tablet 100 mg        100 mg Oral Every 12 hours 03/29/23 1222 03/31/23 2105   03/25/23 1530  doxycycline (VIBRA-TABS) tablet 100 mg  Status:  Discontinued        100 mg Oral Every 12 hours 03/25/23 1439 03/29/23 1222   03/25/23 1500  amoxicillin-clavulanate (AUGMENTIN) 875-125 MG per tablet 1 tablet  Status:  Discontinued        1 tablet Oral Every 12 hours 03/25/23 1404 03/25/23 1439           Data Reviewed:  I have personally reviewed the following...  CBC: Recent Labs  Lab 03/28/23 0504 03/31/23 0450  WBC 7.2 8.7  HGB 14.6 14.4  HCT 42.8 43.4   MCV 91.6 93.1  PLT 173 180   Basic Metabolic Panel: Recent Labs  Lab 03/31/23 0450  NA 135  K 3.9  CL 105  CO2 22  GLUCOSE 96  BUN 15  CREATININE 0.93  CALCIUM 9.6  MG 1.9  PHOS 4.7*   GFR: Estimated Creatinine Clearance: 119.3 mL/min (by C-G formula based on SCr of 0.93 mg/dL). Liver Function Tests: No results for input(s): "AST", "ALT", "ALKPHOS", "BILITOT", "PROT", "ALBUMIN" in the last 168 hours.  No results for input(s): "LIPASE", "AMYLASE" in the last 168 hours. Recent Labs  Lab 03/27/23 0620 03/31/23 0450  AMMONIA 37* 34   Coagulation Profile: No results for input(s): "INR", "PROTIME" in the last 168 hours. Cardiac Enzymes: No results for input(s): "CKTOTAL", "CKMB", "CKMBINDEX", "TROPONINI" in the last 168 hours. BNP (last 3 results) No results for input(s): "PROBNP" in the last 8760 hours. HbA1C: No results for input(s): "HGBA1C" in the last 72 hours. CBG: No results for input(s): "GLUCAP" in the last 168 hours. Lipid Profile: No results for input(s): "CHOL", "HDL", "LDLCALC", "TRIG", "CHOLHDL", "LDLDIRECT" in the last 72 hours. Thyroid Function Tests: No results for input(s): "TSH", "T4TOTAL", "FREET4", "T3FREE", "THYROIDAB" in the last 72 hours. Anemia Panel: No results for input(s): "VITAMINB12", "FOLATE", "FERRITIN", "TIBC", "IRON", "RETICCTPCT" in the last 72 hours. Most Recent Urinalysis On File:     Component Value Date/Time   COLORURINE YELLOW (A) 03/25/2023 0051   APPEARANCEUR CLEAR (A) 03/25/2023 0051   APPEARANCEUR Clear 02/19/2021 1457   LABSPEC 1.013 03/25/2023 0051   PHURINE 6.0 03/25/2023 0051   GLUCOSEU NEGATIVE 03/25/2023 0051   HGBUR NEGATIVE 03/25/2023 0051   BILIRUBINUR NEGATIVE 03/25/2023 0051   BILIRUBINUR Negative 02/19/2021 1457   KETONESUR NEGATIVE 03/25/2023 0051   PROTEINUR NEGATIVE 03/25/2023 0051   UROBILINOGEN 1.0 12/20/2009 1128   NITRITE NEGATIVE 03/25/2023 0051   LEUKOCYTESUR NEGATIVE 03/25/2023 0051   Sepsis  Labs: @LABRCNTIP (procalcitonin:4,lacticidven:4) Microbiology: Recent Results (from the past 240 hour(s))  MRSA Next Gen by PCR, Nasal     Status: None   Collection Time: 03/25/23  7:58 AM   Specimen: Nasal Mucosa; Nasal Swab  Result Value Ref Range Status   MRSA by PCR Next Gen NOT DETECTED NOT DETECTED Final    Comment: (NOTE) The GeneXpert MRSA Assay (FDA approved for NASAL specimens only), is one component of a comprehensive MRSA colonization surveillance program. It is not intended to diagnose MRSA infection nor to guide or monitor treatment for MRSA infections. Test performance is not FDA approved in patients less than 37 years old. Performed at [facility], [address] [place], [place] [address]   Culture, blood (Routine X 2) w Reflex to ID Panel     Status: None   Collection Time: 03/27/23  9:59 AM   Specimen: BLOOD  Result Value Ref Range Status   Specimen Description BLOOD RFOA  Final   Special Requests   Final    BOTTLES DRAWN AEROBIC AND ANAEROBIC Blood Culture adequate volume   Culture   Final    NO GROWTH 5 DAYS Performed at [facility], [address], [place], [place] [address]    Report Status 04/01/2023 FINAL  Final  Culture, blood (Routine X 2) w Reflex to ID Panel     Status: None   Collection Time: 03/27/23  9:59 AM   Specimen: BLOOD  Result Value Ref Range Status   Specimen Description BLOOD [facility]  Final   Special Requests   Final    BOTTLES DRAWN AEROBIC AND ANAEROBIC Blood Culture adequate volume   Culture   Final    NO GROWTH 5 DAYS Performed at [facility], [address] [place], [place] [address]    Report Status 04/01/2023 FINAL  Final      Radiology Studies last 3 days: No results found.           LOS: 7 days      Sunnie Nielsen, DO [facility] 04/02/2023, 2:53 PM  Dictation software may have been used to generate the above note. Typos may occur and escape review in  typed/dictated notes. Please contact Dr Lyn Hollingshead directly for clarity if needed.  Staff may message me via secure chat in Epic  but this may not receive an immediate response,  please page me for urgent matters!  If 7PM-7AM, please contact night coverage [email or web address]

## 2023-04-02 NOTE — Progress Notes (Signed)
Mobility Specialist - Progress Note   04/02/23 0902  Mobility  Activity Ambulated independently in hallway  Level of Assistance Independent  Assistive Device None  Distance Ambulated (ft) 640 ft  Activity Response Tolerated well  Mobility Referral Yes  $Mobility charge 1 Mobility  Mobility Specialist Start Time (ACUTE ONLY) 0759  Mobility Specialist Stop Time (ACUTE ONLY) [id number]  Mobility Specialist Time Calculation (min) (ACUTE ONLY) 15 min    Pt OOB in hallway on RA upon arrival. Pt ambulates 4 laps around NS Indep with no LOB noted. Pt returns to recliner with needs in reach.   Terrilyn Saver  Mobility Specialist  04/02/23 9:03 AM

## 2023-04-02 NOTE — Hospital Course (Addendum)
Louis Edwards is a 37 y.o. male with medical history significant for ADHD, ODD, and moderate developmental disability, who presented to the emergency room for psychiatry evaluation for aggression and homicidal ideations.  Per his guardian who is his mother the patient has tendency to go off on people with upset.  She reported patient being aggressive to staff members.  Patient denied any suicidal thoughts. His group home owner reported that he is a 2:1 for staff and had funding for 1:1 and cannot afford to have him return as he does not have staffing to meet his needs.  Psych consulted here, Dx intermittent explosive d/o and valproic acid tox, TOC consult placed for resource assistance and possible placement. Based on his low Lithium level and Depakote being on the lower end of normal, doses where adjusted along with his Risperdal to assist in his aggressive tendencies. Pending insurance approval for placement but this has been difficult given his outbursts   Consultants:  Psychiatry   Procedures: None       ASSESSMENT & PLAN:   Principal Problem:   Altered mental status Active Problems:   Valproic acid toxicity   OSA (obstructive sleep apnea)   Intermittent explosive disorder   Essential hypertension   Paronychia of great toe, right   Abnormal behavior   Altered mental status, improved likely secondary to mild valproic acid toxicity. monitor his mental status. S/p IVF given for hydration. Continue oral hydration   Valproic acid toxicity Depakote level 117--100-85--90---61 trended down Decreased Depakote 40 mg p.o. twice daily Discussed with psych, cleared for discharge to group home  Intermittent explosive disorder Complicates placement options  continue Haldol and lithium as well as psychotropic medications. Haldol + ativan prn IM  Fever, on 6/21, unknown cause, resolved 6/21 Tmax 101 Blood cultures NGTD Continue monitor vital signs   Hyperammonia could be due to  Depakote Ammonia level 57 mildly elevated S/p lactulose BID, ammonia level 37--34, trended down Decreased lactulose 10 g p.o. daily, can d/c and periodically monitor ammonia levels     OSA (obstructive sleep apnea) Will continue home CPAP nightly.   Cellulitis of great toe, right 6/19 Started doxycycline 100 mg p.o. twice daily for 7 days Picture saved in the media section May need podiatry f/u for toenail removal    Essential hypertension s/p lisinopril 20 mg p.o. daily, on 6/24 discontinued lisinopril due to low blood pressure Continued clonidine 0.1 mg in a.m. and 0.2 mg in the p.m. with holding parameters Monitor BP and titrate medication accordingly      DVT prophylaxis: lovenox  Pertinent IV fluids/nutrition: no continuous IV fluids  Central lines / invasive devices: none  Code Status: FULL CODE ACP documentation reviewed: none on file  Current Admission Status: inpatietn  TOC needs / Dispo plan: placement Barriers to discharge / significant pending items: placement

## 2023-04-02 NOTE — Progress Notes (Signed)
   04/02/23 1400  Spiritual Encounters  Type of Visit Initial  Care provided to: Patient  Referral source Chaplain assessment  Reason for visit Routine spiritual support  OnCall Visit No  Spiritual Framework  Presenting Themes Meaning/purpose/sources of inspiration  Interventions  Spiritual Care Interventions Made Compassionate presence;Prayer  Intervention Outcomes  Outcomes Awareness of support  Spiritual Care Plan  Spiritual Care Issues Still Outstanding No further spiritual care needs at this time (see row info)   Chaplain provided spiritual support with compassionate care. Chaplain services remain available for follow up spiritual and emotional support as needed. Please consult if needs arise
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
## 2023-04-03 DIAGNOSIS — F6381 Intermittent explosive disorder: Secondary | ICD-10-CM | POA: Diagnosis not present

## 2023-04-03 MED ORDER — MELATONIN 10 MG PO TABS: 10.0000 mg | ORAL_TABLET | Freq: Every day | ORAL | 0 refills | Status: AC

## 2023-04-03 MED ORDER — LURASIDONE HCL 60 MG PO TABS: 60.0000 mg | ORAL_TABLET | Freq: Every day | ORAL | 0 refills | Status: AC

## 2023-04-03 MED ORDER — IPRATROPIUM BROMIDE 0.03 % NA SOLN: 2.0000 | Freq: Two times a day (BID) | NASAL | 12 refills | Status: AC

## 2023-04-03 MED ORDER — DIVALPROEX SODIUM 500 MG PO DR TAB: 500.0000 mg | DELAYED_RELEASE_TABLET | Freq: Two times a day (BID) | ORAL | 0 refills | Status: AC

## 2023-04-03 MED ORDER — MIRABEGRON ER 25 MG PO TB24: 25.0000 mg | ORAL_TABLET | Freq: Every day | ORAL | 0 refills | Status: AC

## 2023-04-03 MED ORDER — BACITRACIN ZINC 500 UNIT/GM EX OINT: TOPICAL_OINTMENT | Freq: Two times a day (BID) | CUTANEOUS | 0 refills | Status: AC

## 2023-04-03 MED ORDER — CLONIDINE HCL 0.1 MG PO TABS: 0.1000 mg | ORAL_TABLET | Freq: Every day | ORAL | 0 refills | Status: AC

## 2023-04-03 MED ORDER — HYDROXYZINE HCL 50 MG PO TABS: 50.0000 mg | ORAL_TABLET | Freq: Three times a day (TID) | ORAL | 0 refills | Status: AC | PRN

## 2023-04-03 MED ORDER — TRAZODONE HCL 50 MG PO TABS: 25.0000 mg | ORAL_TABLET | Freq: Every evening | ORAL | 0 refills | Status: AC | PRN

## 2023-04-03 MED ORDER — PANTOPRAZOLE SODIUM 40 MG PO TBEC: 40.0000 mg | DELAYED_RELEASE_TABLET | Freq: Every day | ORAL | 0 refills | Status: AC

## 2023-04-03 MED ORDER — DULOXETINE HCL 30 MG PO CPEP: 30.0000 mg | ORAL_CAPSULE | Freq: Two times a day (BID) | ORAL | 0 refills | Status: AC

## 2023-04-03 MED ORDER — CLONIDINE HCL 0.2 MG PO TABS: 0.2000 mg | ORAL_TABLET | Freq: Every day | ORAL | 0 refills | Status: AC

## 2023-04-03 MED ORDER — SULFAMETHOXAZOLE-TRIMETHOPRIM 800-160 MG PO TABS: 1.0000 | ORAL_TABLET | Freq: Two times a day (BID) | ORAL | 0 refills | Status: AC

## 2023-04-03 MED ORDER — LITHIUM CARBONATE 300 MG PO CAPS: 300.0000 mg | ORAL_CAPSULE | Freq: Two times a day (BID) | ORAL | 0 refills | Status: AC
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
## 2023-04-03 NOTE — Progress Notes (Signed)
Writer in to greet pt and asked pt to stay in his room given earlier behaviors and aggressive actions . Pt agreed with plan . Sitting in chair at bedside.

## 2023-04-03 NOTE — Progress Notes (Signed)
Pt discharged, pt escorted out by security, 3 [place] police officers present

## 2023-04-03 NOTE — Progress Notes (Signed)
Called [phone]. Connected to voicemail. Left message to call administrative coordinator as soon as possible. Reassured nothing bad had happened to him medically.

## 2023-04-03 NOTE — Progress Notes (Signed)
Spoke with guardian/mother Felipa Furnace. Informed her of recent events.

## 2023-04-03 NOTE — TOC Progression Note (Signed)
Transition of Care [facility]) - Progression Note    Patient Details  Name: Louis Edwards MRN: [id number] Date of Birth: 06/17/1986  Transition of Care [facility]) CM/SW Contact  Darleene Cleaver, [place] Phone Number: 04/02/2023 2:45pm  Clinical Narrative:     CSW spoke to [employer] [facility]	[place].  She stated there is a alternative family care home that is considering patient called [facility].  They would like to set up a virtual interview with him for tomorrow at 1:30pm.  CSW scheduled a virtual assessment.  Per Nelma Rothman, she stated that [facility] group home is reconsidering him as well since he has been there in the past and they are familiar with him.  Per Nelma Rothman, they are trying to get an increase in funding approved in order to accept patient.  [facility] reviewed and assessed patient in May and were going to take him, but they did not have the right Medicaid classification in order to be considered a behavioral health home.  [facility] is working with Medicaid to get their classification changed so they can still consider taking him.  Per Nelma Rothman she has also reached out to [facility] which has 10 facilities, and none of their facilities are willing to accept him due to his behaviors.  Nelma Rothman is continuing to look for new placement for patient and has been unsuccessful.  CSW to continue to follow patient's progress throughout discharge planning.  Expected Discharge Plan: Group Home Barriers to Discharge:  (placement)  Expected Discharge Plan and Services       Living arrangements for the past 2 months: Group Home                                       Social Determinants of Health (SDOH) Interventions SDOH Screenings   Food Insecurity: Patient Unable To Answer (03/25/2023)  Housing: Patient Unable To Answer (03/25/2023)  Transportation Needs: Patient Unable To Answer (03/25/2023)  Utilities: Patient Unable To Answer (03/25/2023)  Tobacco Use: Low Risk   (03/25/2023)    Readmission Risk Interventions     No data to display

## 2023-04-03 NOTE — Discharge Summary (Signed)
Physician Discharge Summary   Patient: Louis Edwards MRN: [id number]  DOB: 05-10-1986   Admit:     Date of Admission: 01/22/2023 Admitted from: group home    Discharge: Date of discharge: 04/03/23 Disposition:  police custrody Condition at discharge: good  CODE STATUS: FULL CODE      Discharge Physician: Sunnie Nielsen, DO [facility]     PCP: Anselm Jungling, NP  Recommendations for Outpatient Follow-up:  Follow up with PCP pending behavioral health care course in police custody       Discharge Diagnoses: Principal Problem:   Intermittent explosive disorder Active Problems:   Altered mental status   Valproic acid toxicity   OSA (obstructive sleep apnea)   Essential hypertension   Paronychia of great toe, right   Abnormal behavior       Hospital Course: Louis Edwards is a 37 y.o. male with medical history significant for ADHD, ODD, and moderate developmental disability, who presented to the emergency room for psychiatry evaluation for aggression and homicidal ideations.  Per his guardian who is his mother the patient has tendency to go off on people with upset.  She reported patient being aggressive to staff members.  Patient denied any suicidal thoughts. His group home owner reported that he is a 2:1 for staff and had funding for 1:1 and cannot afford to have him return as he does not have staffing to meet his needs.  Psych consulted here, Dx intermittent explosive d/o and valproic acid tox, TOC consult placed for resource assistance and possible placement. Based on his low Lithium level and Depakote being on the lower end of normal, doses where adjusted along with his Risperdal to assist in his aggressive tendencies. Pending insurance approval for placement but this has been difficult given his outbursts. Becoming aggressive and violent with staff, severe overnight 06/27-06/28. Pt medically stable for discharge. Per psychiatry reevaluation 04/03/23,  )see note from Nanine Means, NP) his behavior deemed to be his baseline, not likely due to uncontrolled mental health condition, would not be helped by admission to behavioral health unit, therefore since patient was medically stable to discharge he was discharged into custody of police. I (Dr. Sunnie Nielsen) discussed the case with Dr Delfino Lovett, medical director on call.   Consultants:  Psychiatry   Procedures: None       ASSESSMENT & PLAN:   Altered mental status, improved likely secondary to mild valproic acid toxicity. monitor his mental status. S/p IVF given for hydration. Continue oral hydration now   Valproic acid toxicity Depakote level 117--100-85--90---61 trended down Decreased Depakote 40 mg p.o. twice daily Discussed with psych, cleared for discharge   Intermittent explosive disorder Complicates placement options  continue Haldol and lithium as well as psychotropic medication per med rec Haldol + ativan prn IM would be appropriate if patient is a danger to self/others   Fever, on 6/21, unknown cause, resolved 6/21 Tmax 101 Blood cultures NGTD Continue monitor vital signs   Hyperammonia could be due to Depakote Ammonia level 57 mildly elevated Lactulose d/c periodically monitor ammonia levels     OSA (obstructive sleep apnea) Will continue home CPAP nightly.   Cellulitis of great toe, right 6/19 Started doxycycline 100 mg p.o. twice daily for 7 days but persisted, discharge on bactrim  Picture saved in the media section May need podiatry f/u for toenail removal    Essential hypertension s/p lisinopril 20 mg p.o. daily, on 6/24 discontinued lisinopril due to low blood pressure  Continued clonidine 0.1 mg in a.m. and 0.2 mg in the p.m. with holding parameters Monitor BP and titrate medication accordingly               Discharge Instructions  Allergies as of 04/03/2023       Reactions   Keflex [cephalexin]    Thorazine [chlorpromazine]          Medication List     STOP taking these medications    divalproex 500 MG 24 hr tablet Commonly known as: DEPAKOTE ER Replaced by: divalproex 500 MG DR tablet   Gemtesa 75 MG Tabs Generic drug: Vibegron Replaced by: mirabegron ER 25 MG Tb24 tablet   lisinopril 20 MG tablet Commonly known as: ZESTRIL   omeprazole 20 MG capsule Commonly known as: PRILOSEC Replaced by: pantoprazole 40 MG tablet   risperiDONE 0.5 MG tablet Commonly known as: RISPERDAL       TAKE these medications    acetaminophen 325 MG tablet Commonly known as: TYLENOL Take 650 mg by mouth every 6 (six) hours as needed.   albuterol 108 (90 Base) MCG/ACT inhaler Commonly known as: VENTOLIN HFA as needed.   azelastine 0.1 % nasal spray Commonly known as: ASTELIN Place 1 spray into both nostrils 2 (two) times daily. Use in each nostril as directed   bacitracin ointment Apply topically 2 (two) times daily. Tp R great toe nail and surrounding skin   benztropine 1 MG tablet Commonly known as: COGENTIN Take 1 mg by mouth 2 (two) times daily.   clonazePAM 1 MG tablet Commonly known as: KLONOPIN Take 1 mg by mouth 2 (two) times daily.   cloNIDine 0.2 MG tablet Commonly known as: CATAPRES Take 1 tablet (0.2 mg total) by mouth at bedtime. What changed: You were already taking a medication with the same name, and this prescription was added. Make sure you understand how and when to take each.   cloNIDine 0.1 MG tablet Commonly known as: CATAPRES Take 1 tablet (0.1 mg total) by mouth daily. Start taking on: April 04, 2023 What changed:  how much to take when to take this additional instructions   diclofenac Sodium 1 % Gel Commonly known as: VOLTAREN Apply 2 g topically 4 (four) times daily.   divalproex 500 MG DR tablet Commonly known as: DEPAKOTE Take 1 tablet (500 mg total) by mouth every 12 (twelve) hours. Replaces: divalproex 500 MG 24 hr tablet   DULoxetine 30 MG  capsule Commonly known as: CYMBALTA Take 1 capsule (30 mg total) by mouth 2 (two) times daily.   guaiFENesin 600 MG 12 hr tablet Commonly known as: MUCINEX Take 600 mg by mouth 2 (two) times daily as needed.   haloperidol 5 MG tablet Commonly known as: HALDOL Take 10 mg by mouth daily.   haloperidol 0.5 MG tablet Commonly known as: HALDOL Take 0.5 mg by mouth daily as needed for agitation.   hydrOXYzine 50 MG tablet Commonly known as: ATARAX Take 1 tablet (50 mg total) by mouth 3 (three) times daily as needed for anxiety. What changed:  when to take this reasons to take this   ipratropium 0.03 % nasal spray Commonly known as: ATROVENT Place 2 sprays into the nose 2 (two) times daily. What changed:  how to take this when to take this   lithium carbonate 300 MG capsule Take 1 capsule (300 mg total) by mouth 2 (two) times daily with a meal. What changed:  medication strength how much to take when to take this  Lurasidone HCl 60 MG Tabs Take 1 tablet (60 mg total) by mouth daily with breakfast. Start taking on: April 04, 2023 What changed: when to take this   Melatonin 10 MG Tabs Take 10 mg by mouth at bedtime. What changed:  how much to take when to take this   mirabegron ER 25 MG Tb24 tablet Commonly known as: MYRBETRIQ Take 1 tablet (25 mg total) by mouth daily. Start taking on: April 04, 2023 Replaces: Gemtesa 75 MG Tabs   pantoprazole 40 MG tablet Commonly known as: PROTONIX Take 1 tablet (40 mg total) by mouth daily. Start taking on: April 04, 2023 Replaces: omeprazole 20 MG capsule   sulfamethoxazole-trimethoprim 800-160 MG tablet Commonly known as: BACTRIM DS Take 1 tablet by mouth 2 (two) times daily for 7 days.   traZODone 50 MG tablet Commonly known as: DESYREL Take 0.5 tablets (25 mg total) by mouth at bedtime as needed for sleep.          Allergies  Allergen Reactions   Keflex [Cephalexin]    Thorazine [Chlorpromazine]       Subjective: pt is alert, he asks about discharge to mental health. He and I discussed his behavior of punching and lashing out at staff, and that I am releasing him from the hospital into police custody. He offers no complaint.    Discharge Exam: BP (!) 133/92 (BP Location: Left Arm)   Pulse 77   Temp 100.1 F (37.8 C) (Oral)   Resp 18   Ht 5\' 5"  (1.651 m)   Wt 99.8 kg   SpO2 96%   BMI 36.61 kg/m  General: Pt is alert, awake, not in acute distress Cardiovascular: RRR, S1/S2 +, no rubs, no gallops Respiratory: CTA bilaterally, no wheezing, no rhonchi Abdominal: Soft, NT, ND, bowel sounds + Extremities: no edema, no cyanosis     The results of significant diagnostics from this hospitalization (including imaging, microbiology, ancillary and laboratory) are listed below for reference.     Microbiology: Recent Results (from the past 240 hour(s))  MRSA Next Gen by PCR, Nasal     Status: None   Collection Time: 03/25/23  7:58 AM   Specimen: Nasal Mucosa; Nasal Swab  Result Value Ref Range Status   MRSA by PCR Next Gen NOT DETECTED NOT DETECTED Final    Comment: (NOTE) The GeneXpert MRSA Assay (FDA approved for NASAL specimens only), is one component of a comprehensive MRSA colonization surveillance program. It is not intended to diagnose MRSA infection nor to guide or monitor treatment for MRSA infections. Test performance is not FDA approved in patients less than 59 years old. Performed at [facility], [address] [place], [place] [address]   Culture, blood (Routine X 2) w Reflex to ID Panel     Status: None   Collection Time: 03/27/23  9:59 AM   Specimen: BLOOD  Result Value Ref Range Status   Specimen Description BLOOD RFOA  Final   Special Requests   Final    BOTTLES DRAWN AEROBIC AND ANAEROBIC Blood Culture adequate volume   Culture   Final    NO GROWTH 5 DAYS Performed at [facility], [address] [place], [place] [address]     Report Status 04/01/2023 FINAL  Final  Culture, blood (Routine X 2) w Reflex to ID Panel     Status: None   Collection Time: 03/27/23  9:59 AM   Specimen: BLOOD  Result Value Ref Range Status   Specimen Description  BLOOD BLH  Final   Special Requests   Final    BOTTLES DRAWN AEROBIC AND ANAEROBIC Blood Culture adequate volume   Culture   Final    NO GROWTH 5 DAYS Performed at [facility], [address] [place], [place] [address]    Report Status 04/01/2023 FINAL  Final     Labs: BNP (last 3 results) No results for input(s): "BNP" in the last 8760 hours. Basic Metabolic Panel: Recent Labs  Lab 03/31/23 0450  NA 135  K 3.9  CL 105  CO2 22  GLUCOSE 96  BUN 15  CREATININE 0.93  CALCIUM 9.6  MG 1.9  PHOS 4.7*   Liver Function Tests: No results for input(s): "AST", "ALT", "ALKPHOS", "BILITOT", "PROT", "ALBUMIN" in the last 168 hours. No results for input(s): "LIPASE", "AMYLASE" in the last 168 hours. Recent Labs  Lab 03/31/23 0450  AMMONIA 34   CBC: Recent Labs  Lab 03/28/23 0504 03/31/23 0450  WBC 7.2 8.7  HGB 14.6 14.4  HCT 42.8 43.4  MCV 91.6 93.1  PLT 173 180   Cardiac Enzymes: No results for input(s): "CKTOTAL", "CKMB", "CKMBINDEX", "TROPONINI" in the last 168 hours. BNP: Invalid input(s): "POCBNP" CBG: No results for input(s): "GLUCAP" in the last 168 hours. D-Dimer No results for input(s): "DDIMER" in the last 72 hours. Hgb A1c No results for input(s): "HGBA1C" in the last 72 hours. Lipid Profile No results for input(s): "CHOL", "HDL", "LDLCALC", "TRIG", "CHOLHDL", "LDLDIRECT" in the last 72 hours. Thyroid function studies No results for input(s): "TSH", "T4TOTAL", "T3FREE", "THYROIDAB" in the last 72 hours.  Invalid input(s): "FREET3" Anemia work up No results for input(s): "VITAMINB12", "FOLATE", "FERRITIN", "TIBC", "IRON", "RETICCTPCT" in the last 72 hours. Urinalysis    Component Value Date/Time   COLORURINE YELLOW (A)  03/25/2023 0051   APPEARANCEUR CLEAR (A) 03/25/2023 0051   APPEARANCEUR Clear 02/19/2021 1457   LABSPEC 1.013 03/25/2023 0051   PHURINE 6.0 03/25/2023 0051   GLUCOSEU NEGATIVE 03/25/2023 0051   HGBUR NEGATIVE 03/25/2023 0051   BILIRUBINUR NEGATIVE 03/25/2023 0051   BILIRUBINUR Negative 02/19/2021 1457   KETONESUR NEGATIVE 03/25/2023 0051   PROTEINUR NEGATIVE 03/25/2023 0051   UROBILINOGEN 1.0 12/20/2009 1128   NITRITE NEGATIVE 03/25/2023 0051   LEUKOCYTESUR NEGATIVE 03/25/2023 0051   Sepsis Labs Recent Labs  Lab 03/28/23 0504 03/31/23 0450  WBC 7.2 8.7   Microbiology Recent Results (from the past 240 hour(s))  MRSA Next Gen by PCR, Nasal     Status: None   Collection Time: 03/25/23  7:58 AM   Specimen: Nasal Mucosa; Nasal Swab  Result Value Ref Range Status   MRSA by PCR Next Gen NOT DETECTED NOT DETECTED Final    Comment: (NOTE) The GeneXpert MRSA Assay (FDA approved for NASAL specimens only), is one component of a comprehensive MRSA colonization surveillance program. It is not intended to diagnose MRSA infection nor to guide or monitor treatment for MRSA infections. Test performance is not FDA approved in patients less than 59 years old. Performed at [facility], [address] [place], [place] [address]   Culture, blood (Routine X 2) w Reflex to ID Panel     Status: None   Collection Time: 03/27/23  9:59 AM   Specimen: BLOOD  Result Value Ref Range Status   Specimen Description BLOOD RFOA  Final   Special Requests   Final    BOTTLES DRAWN AEROBIC AND ANAEROBIC Blood Culture adequate volume   Culture   Final  NO GROWTH 5 DAYS Performed at [facility], [address] [place], [place] [address]    Report Status 04/01/2023 FINAL  Final  Culture, blood (Routine X 2) w Reflex to ID Panel     Status: None   Collection Time: 03/27/23  9:59 AM   Specimen: BLOOD  Result Value Ref Range Status   Specimen Description BLOOD [facility]  Final   Special  Requests   Final    BOTTLES DRAWN AEROBIC AND ANAEROBIC Blood Culture adequate volume   Culture   Final    NO GROWTH 5 DAYS Performed at [facility], [address], [place], [place] [address]    Report Status 04/01/2023 FINAL  Final   Imaging No results found.    Time coordinating discharge: over 30 minutes  SIGNED:  Sunnie Nielsen DO [facility]
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
## 2023-07-30 ENCOUNTER — Ambulatory Visit (INDEPENDENT_AMBULATORY_CARE_PROVIDER_SITE_OTHER): Payer: Medicare Other | Admitting: Podiatry

## 2023-07-30 DIAGNOSIS — Z91199 Patient's noncompliance with other medical treatment and regimen due to unspecified reason: Secondary | ICD-10-CM

## 2023-07-30 NOTE — Progress Notes (Signed)
1. No-show for appointment     

## 2023-07-31 ENCOUNTER — Ambulatory Visit (INDEPENDENT_AMBULATORY_CARE_PROVIDER_SITE_OTHER): Payer: Medicare Other | Admitting: Podiatry

## 2023-07-31 ENCOUNTER — Encounter: Payer: Self-pay | Admitting: Podiatry

## 2023-07-31 VITALS — Ht 65.0 in | Wt 220.0 lb

## 2023-07-31 DIAGNOSIS — B351 Tinea unguium: Secondary | ICD-10-CM

## 2023-07-31 DIAGNOSIS — M79674 Pain in right toe(s): Secondary | ICD-10-CM | POA: Diagnosis not present

## 2023-07-31 DIAGNOSIS — M79675 Pain in left toe(s): Secondary | ICD-10-CM

## 2023-08-06 NOTE — Progress Notes (Signed)
  Subjective:  Patient ID: Louis Edwards, male    DOB: [birth year],  MRN: [id number]  37 y.o. male presents to clinic with  painful elongated mycotic toenails 1-5 bilaterally which are tender when wearing enclosed shoe gear. Pain is relieved with periodic professional debridement.   He is accompanied by his caregiver, Louis Edwards, on today's visit. Chief Complaint  Patient presents with   Nail Problem    RFC, Pt is not a diabetic, Last office visit was last week and PCP name is Dr Estrella Myrtle.   New problem(s): None   PCP is Anselm Jungling, NP.  Allergies  Allergen Reactions   Keflex [Cephalexin]    Thorazine [Chlorpromazine]    Review of Systems: Negative except as noted in the HPI.   Objective:  Louis Edwards is a pleasant 37 y.o. male in NAD.  Vascular Examination: Vascular status intact b/l with palpable pedal pulses. CFT immediate b/l. No edema. No pain with calf compression b/l. Skin temperature gradient WNL b/l.   Neurological Examination: Patient unable to follow commands of LE neurological examination due to cognitive deficits. Patient does respond to external noxious stimuli.  Dermatological Examination: Pedal skin with normal turgor, texture and tone b/l. Toenails 1-5 b/l thick, discolored, elongated with subungual debris and pain on dorsal palpation. No hyperkeratotic lesions noted b/l.   Musculoskeletal Examination: Muscle strength 5/5 to b/l LE. No pain, crepitus or joint limitation noted with ROM bilateral LE. No gross bony deformities bilaterally.  Radiographs: None  Last A1c:       No data to display           Assessment:   1. Pain due to onychomycosis of toenails of both feet    Plan:  -Caregiver/provider present with patient on today's visit. -Consent given for treatment as described below: -Patient to continue soft, supportive shoe gear daily. -Toenails 1-5 b/l were debrided in length and girth with sterile nail nippers and dremel without  iatrogenic bleeding.  -Patient/POA to call should there be question/concern in the interim.  Return in about 4 months (around 12/01/2023).  Freddie Breech, DPM

## 2023-12-03 ENCOUNTER — Ambulatory Visit (INDEPENDENT_AMBULATORY_CARE_PROVIDER_SITE_OTHER): Payer: Medicare Other | Admitting: Podiatry

## 2023-12-03 DIAGNOSIS — Z91199 Patient's noncompliance with other medical treatment and regimen due to unspecified reason: Secondary | ICD-10-CM

## 2023-12-03 NOTE — Progress Notes (Signed)
 1. No-show for appointment

## 2024-01-11 ENCOUNTER — Ambulatory Visit (INDEPENDENT_AMBULATORY_CARE_PROVIDER_SITE_OTHER): Admitting: Podiatry

## 2024-01-11 ENCOUNTER — Encounter: Payer: Self-pay | Admitting: Podiatry

## 2024-01-11 DIAGNOSIS — M79675 Pain in left toe(s): Secondary | ICD-10-CM

## 2024-01-11 DIAGNOSIS — M79674 Pain in right toe(s): Secondary | ICD-10-CM

## 2024-01-11 DIAGNOSIS — B351 Tinea unguium: Secondary | ICD-10-CM | POA: Diagnosis not present

## 2024-01-11 DIAGNOSIS — M79672 Pain in left foot: Secondary | ICD-10-CM | POA: Diagnosis not present

## 2024-01-11 DIAGNOSIS — M79671 Pain in right foot: Secondary | ICD-10-CM

## 2024-01-11 NOTE — Progress Notes (Unsigned)
  Subjective:  Patient ID: Louis Edwards, male    DOB: May 21, 1986,  MRN: [id number]  38 y.o. male presents {jgcomplaint:23593}  Chief Complaint  Patient presents with   Nail Problem    "Trim my toenails."    New problem(s): None {jgcomplaint:23593}  PCP is Hatchett, Corrie Dandy, NP , and last visit was {Time; dates multiple:15870}.  Allergies  Allergen Reactions   Risperidone Other (See Comments)   Keflex [Cephalexin]    Thorazine [Chlorpromazine]     Review of Systems: Negative except as noted in the HPI.   Objective:  Louis Edwards is a pleasant 38 y.o. male {jgbodyhabitus:24098} AAO x 3.  Vascular Examination: Vascular status intact b/l with palpable pedal pulses. CFT immediate b/l. Pedal hair present. No edema. No pain with calf compression b/l. Skin temperature gradient WNL b/l. No varicosities noted. No cyanosis or clubbing noted.  Neurological Examination: Sensation grossly intact b/l with 10 gram monofilament. Vibratory sensation intact b/l.  Dermatological Examination: Pedal skin with normal turgor, texture and tone b/l. No open wounds nor interdigital macerations noted. Toenails 1-5 b/l thick, discolored, elongated with subungual debris and pain on dorsal palpation. No hyperkeratotic lesions noted b/l.   Musculoskeletal Examination: Muscle strength 5/5 to b/l LE.  No pain, crepitus noted b/l. No gross pedal deformities. Patient ambulates independently without assistive aids.   Radiographs: None  Last A1c:       No data to display           Assessment:   1. Pain due to onychomycosis of toenails of both feet   2. Arch pain of left foot   3. Arch pain of right foot    Plan:  {jgplan:23602::"-Patient/POA to call should there be question/concern in the interim."}  Return in about 3 months (around 04/11/2024).  Freddie Breech, DPM      [facility] LOCATION: [address] [place] [address]                    Office [phone]   [facility] LOCATION: [address] [place], [place] [address] Office [phone]

## 2024-01-15 ENCOUNTER — Encounter: Payer: Self-pay | Admitting: Podiatry

## 2024-01-18 ENCOUNTER — Ambulatory Visit (INDEPENDENT_AMBULATORY_CARE_PROVIDER_SITE_OTHER): Admitting: Podiatry

## 2024-01-18 ENCOUNTER — Ambulatory Visit (INDEPENDENT_AMBULATORY_CARE_PROVIDER_SITE_OTHER)

## 2024-01-18 ENCOUNTER — Encounter: Payer: Self-pay | Admitting: Podiatry

## 2024-01-18 ENCOUNTER — Telehealth: Payer: Self-pay

## 2024-01-18 DIAGNOSIS — R252 Cramp and spasm: Secondary | ICD-10-CM | POA: Diagnosis not present

## 2024-01-18 DIAGNOSIS — M216X2 Other acquired deformities of left foot: Secondary | ICD-10-CM

## 2024-01-18 DIAGNOSIS — M722 Plantar fascial fibromatosis: Secondary | ICD-10-CM

## 2024-01-18 DIAGNOSIS — M216X1 Other acquired deformities of right foot: Secondary | ICD-10-CM | POA: Diagnosis not present

## 2024-01-18 NOTE — Progress Notes (Signed)
  Subjective:  Patient ID: Louis Edwards, male    DOB: January 25, 1986,  MRN: [id number]  Chief Complaint  Patient presents with   Foot Pain    "I have an arch problem." (Bilateral)    Discussed the use of AI scribe software for clinical note transcription with the patient, who gave verbal consent to proceed.  History of Present Illness Louis Edwards is a 38 year old male who presents with bilateral foot pain characterized by cramps.  He experiences bilateral foot pain, specifically in the arches, described as cramping. The pain primarily occurs at night when he goes to bed and sometimes during the day. No pain is present while walking, and there is no pain upon movement or palpation of the feet. The pain does not radiate and is not associated with any specific activities. He has not experienced relief from any prior treatments, as none were mentioned. He has not had any prior physical therapy or other interventions for this issue.  He resides in [place] and has expressed concern about accessing certain facilities due to personal reasons.  No pain while walking and no pain upon movement or palpation of the feet.      Objective:    Physical Exam VASCULAR: DP and PT pulse palpable. Foot is warm and well-perfused. Capillary fill time is brisk. DERMATOLOGIC: Normal skin turgor, texture, and temperature. No open lesions, rashes, or ulcerations. NEUROLOGIC: Normal sensation to light touch and pressure. No paresthesias. 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 Limited range of motion of ankle and toes bilaterally. No pain on palpation to midarch, plantar fascia, or heel bilaterally. No ecchymosis or bruising. No gross deformity.   No images are attached to the encounter.    Results RADIOLOGY Foot X-ray: No osseous abnormalities   Assessment:   1. Equinus deformity of both feet   2. Nocturnal muscle cramp      Plan:  Patient was  evaluated and treated and all questions answered.  Assessment and Plan Assessment & Plan Spastic equinus with muscle cramping Bilateral foot pain with cramping, primarily nocturnal, occasionally diurnal, absent during ambulation. Physical exam reveals pescavis foot type, significant bilateral equinus contracture, limited ankle and toe range of motion, no midarch, plantar fascia, or heel tenderness. X-rays show no bone abnormalities. Likely due to tight leg and calf muscles. - Refer to [facility] for stretching and strengthening exercises. - Advise on hydration and maintaining electrolyte balance to manage cramps. - Encourage follow-up with primary care provider.      Return if symptoms worsen or fail to improve.

## 2024-01-18 NOTE — Telephone Encounter (Signed)
 Referral, office note and demographics faxed to [facility] on [address] Phone [phone], [phone]

## 2024-01-18 NOTE — Patient Instructions (Signed)
 VISIT SUMMARY:  You came in today because of cramping pain in both of your feet, especially in the arches. The pain mostly happens at night when you go to bed and sometimes during the day, but it does not occur while you are walking or moving your feet. There is no pain when your feet are touched or moved.  YOUR PLAN:  -SPASTIC EQUINUS WITH MUSCLE CRAMPING: This condition involves tightness in the calf muscles and limited range of motion in the ankles and toes, leading to cramping pain in the feet. We recommend that you visit [facility] for exercises to stretch and strengthen your muscles. Additionally, make sure to stay hydrated and maintain a good balance of electrolytes to help manage the cramps.  INSTRUCTIONS:  Please follow up with your primary care provider as advised.

## 2024-01-18 NOTE — Telephone Encounter (Signed)
-----   Message from Floyce Hutching sent at 01/18/2024 12:02 PM EDT ----- [facility] referral attached if you can send to the [facility] location and they will call to schedule.  Thank you!

## 2024-04-15 ENCOUNTER — Ambulatory Visit (INDEPENDENT_AMBULATORY_CARE_PROVIDER_SITE_OTHER): Admitting: Podiatry

## 2024-04-15 ENCOUNTER — Encounter: Payer: Self-pay | Admitting: Podiatry

## 2024-04-15 VITALS — Ht 65.0 in | Wt 220.0 lb

## 2024-04-15 DIAGNOSIS — M79674 Pain in right toe(s): Secondary | ICD-10-CM

## 2024-04-15 DIAGNOSIS — B351 Tinea unguium: Secondary | ICD-10-CM

## 2024-04-15 DIAGNOSIS — M79675 Pain in left toe(s): Secondary | ICD-10-CM

## 2024-04-21 ENCOUNTER — Encounter: Payer: Self-pay | Admitting: Podiatry

## 2024-04-21 NOTE — Progress Notes (Signed)
  Subjective:  Patient ID: Louis Edwards, male    DOB: [birth year],  MRN: [id number]  Louis Edwards [facility] presents to clinic today for painful mycotic toenails of both feet that are difficult to trim. Pain interferes with daily activities and wearing enclosed shoe gear comfortably. His caregiver is present during today's visit. Chief Complaint  Patient presents with   Nail Problem    Pt is here for Louis Edwards PCP is Dr Gwenith and LOV is unknown.   New problem(s):  with chief concern of injury to right foot. Injury occurred 1-2 weeks ago. Injury recurred as a result of behavior incident per caregiver who is present during today's visit.  PCP is [name], NP.  Allergies  Allergen Reactions   Risperidone  Other (See Comments)   Keflex [Cephalexin]    Thorazine [Chlorpromazine]     Review of Systems: Negative except as noted in the HPI.  Objective: No changes noted in today's physical examination. There were no vitals filed for this visit. [name] is a pleasant 38 y.o. male obese in NAD. AAO x 3.   Vascular Examination: Vascular status intact b/l with palpable pedal pulses. CFT immediate b/l. Pedal hair present. No edema. No pain with calf compression b/l. Skin temperature gradient WNL b/l. No varicosities noted. No cyanosis or clubbing noted.  Neurological Examination: Sensation grossly intact b/l with 10 gram monofilament. Vibratory sensation intact b/l.  Dermatological Examination: Pedal skin with normal turgor, texture and tone b/l. No open wounds nor interdigital macerations noted. Toenails 1-5 b/l thick, discolored, elongated with subungual debris and pain on dorsal palpation. No hyperkeratotic lesions noted b/l.   Musculoskeletal Examination: Muscle strength 5/5 to b/l LE.  No pain, crepitus noted b/l. Patient has tenderness along lateral aspect right foot and ankle. No ecchymosis, no edema RLE.  Radiographs: None  Assessment/Plan: 1. Pain due to onychomycosis of  toenails of both feet     -Consent given for treatment as described below: -Examined patient. -Patient to continue soft, supportive shoe gear daily. -Toenails 1-5 b/l were debrided in length and girth with sterile nail nippers and dremel without iatrogenic bleeding.  -Patient referred to Dr. Juliene Medicine for evaluation of right foot/ankle pain. -Patient/POA to call should there be question/concern in the interim.   Return in about 3 months (around 07/16/2024).  [name], DPM      [facility] LOCATION: [address], [place] [address]                   Office [phone]   [facility] LOCATION: [address] [place], [place] [address] Office [phone]

## 2024-04-25 ENCOUNTER — Ambulatory Visit (INDEPENDENT_AMBULATORY_CARE_PROVIDER_SITE_OTHER): Admitting: Podiatry

## 2024-04-25 ENCOUNTER — Ambulatory Visit (INDEPENDENT_AMBULATORY_CARE_PROVIDER_SITE_OTHER)

## 2024-04-25 VITALS — Ht 65.0 in | Wt 220.0 lb

## 2024-04-25 DIAGNOSIS — S93409S Sprain of unspecified ligament of unspecified ankle, sequela: Secondary | ICD-10-CM | POA: Diagnosis not present

## 2024-04-25 DIAGNOSIS — M7751 Other enthesopathy of right foot: Secondary | ICD-10-CM

## 2024-04-25 NOTE — Progress Notes (Signed)
  Subjective:  Patient ID: Louis Edwards, male    DOB: 06-12-1986,  MRN: [id number]  Chief Complaint  Patient presents with   Foot Pain    Rm 6 Patient is here for right ankle pain. Patient states pain has been present for several month, its a constant dull achy pain (whether sitting, standing or laying down).    38 y.o. male presents with the above complaint. History confirmed with patient.  Does not recall specific traumatic event but thinks he may have broken his fibula before  Objective:  Physical Exam: warm, good capillary refill, no trophic changes or ulcerative lesions, normal DP and PT pulses, normal sensory exam, and no tenderness to palpation smooth range of motion of ankle joint, 5 out of 5 strength in all planes   Radiographs: Multiple views x-ray of right ankle: no fracture, dislocation, swelling or degenerative changes noted Assessment:   1. Mild ankle sprain, sequela      Plan:  Patient was evaluated and treated and all questions answered.  Suspect he at some point has had a mild ankle sprain.  I recommended compression sleeve which I applied today home exercises which were dispensed and follow-up as needed.  Could consider physical therapy  Return if symptoms worsen or fail to improve.

## 2024-04-25 NOTE — Patient Instructions (Signed)
Ankle Sprain   An ankle sprain is a stretch or tear in one of the tough tissues (ligaments) that connect the bones in your ankle. An ankle sprain can happen when the ankle rolls outward (inversion sprain) or inward (eversion sprain). What are the causes? This condition is caused by rolling or twisting the ankle. What increases the risk? You are more likely to develop this condition if you play sports. What are the signs or symptoms? Symptoms of this condition include: Pain in your ankle. Swelling. Bruising. This may happen right after you sprain your ankle or 1-2 days later. Trouble standing or walking. How is this diagnosed? This condition is diagnosed with: A physical exam. During the exam, your doctor will press on certain parts of your foot and ankle and try to move them in certain ways. X-ray imaging. These may be taken to see how bad the sprain is and to check for broken bones. How is this treated? This condition may be treated with: A brace or splint. This is used to keep the ankle from moving until it heals. An elastic bandage. This is used to support the ankle. Crutches. Pain medicine. Surgery. This may be needed if the sprain is very bad. Physical therapy. This may help to improve movement in the ankle. Follow these instructions at home: If you have a brace or a splint: Wear the brace or splint as told by your doctor. Remove it only as told by your doctor. Loosen the brace or splint if your toes: Tingle. Lose feeling (become numb). Turn cold and blue. Keep the brace or splint clean. If the brace or splint is not waterproof: Do not let it get wet. Cover it with a watertight covering when you take a bath or a shower. If you have an elastic bandage (dressing): Remove it to shower or bathe. Try not to move your ankle much, but wiggle your toes from time to time. This helps to prevent swelling. Adjust the dressing if it feels too tight. Loosen the dressing if your  foot: Loses feeling. Tingles. Becomes cold and blue. Managing pain, stiffness, and swelling   Take over-the-counter and prescription medicines only as told by doctor. For 2-3 days, keep your ankle raised (elevated) above the level of your heart. If told, put ice on the injured area: If you have a removable brace or splint, remove it as told by your doctor. Put ice in a plastic bag. Place a towel between your skin and the bag. Leave the ice on for 20 minutes, 2-3 times a day. General instructions Rest your ankle. Do not use your injured leg to support your body weight until your doctor says that you can. Use crutches as told by your doctor. Do not use any products that contain nicotine or tobacco, such as cigarettes, e-cigarettes, and chewing tobacco. If you need help quitting, ask your doctor. Keep all follow-up visits as told by your doctor. Contact a doctor if: Your bruises or swelling are quickly getting worse. Your pain does not get better after you take medicine. Get help right away if: You cannot feel your toes or foot. Your foot or toes look blue. You have very bad pain that gets worse. Summary An ankle sprain is a stretch or tear in one of the tough tissues (ligaments) that connect the bones in your ankle. This condition is caused by rolling or twisting the ankle. Symptoms include pain, swelling, bruising, and trouble walking. To help with pain and swelling, put ice on   the injured ankle, raise your ankle above the level of your heart, and use an elastic bandage. Also, rest as told by your doctor. Keep all follow-up visits as told by your doctor. This is important. This information is not intended to replace advice given to you by your health care provider. Make sure you discuss any questions you have with your health care provider. Document Revised: 02/16/2018 Document Reviewed: 02/16/2018 Elsevier Patient Education  2020 [employer]  Ankle Sprain, Phase I Rehab An  ankle sprain is an injury to the ligaments of your ankle. Ankle sprains cause stiffness, loss of motion, and loss of strength. Ask your health care provider which exercises are safe for you. Do exercises exactly as told by your health care provider and adjust them as directed. It is normal to feel mild stretching, pulling, tightness, or discomfort as you do these exercises. Stop right away if you feel sudden pain or your pain gets worse. Do not begin these exercises until told by your health care provider. Stretching and range-of-motion exercises These exercises warm up your muscles and joints and improve the movement and flexibility of your lower leg and ankle. These exercises also help to relieve pain and stiffness. Gastroc and soleus stretch  This exercise is also called a calf stretch. It stretches the muscles in the back of the lower leg. These muscles are the gastrocnemius, or gastroc, and the soleus. Sit on the floor with your left / right leg extended. Loop a belt or towel around the ball of your left / right foot. The ball of your foot is on the walking surface, right under your toes. Keep your left / right ankle and foot relaxed and keep your knee straight while you use the belt or towel to pull your foot toward you. You should feel a gentle stretch behind your calf or knee in your gastroc muscle. Hold this position for 10 seconds, then release to the starting position. Repeat the exercise with your knee bent. You can put a pillow or a rolled bath towel under your knee to support it. You should feel a stretch deep in your calf in the soleus muscle or at your Achilles tendon. Repeat 10 times. Complete this exercise 2 times a day. Ankle alphabet   Sit with your left / right leg supported at the lower leg. Do not rest your foot on anything. Make sure your foot has room to move freely. Think of your left / right foot as a paintbrush. Move your foot to trace each letter of the alphabet in the  air. Keep your hip and knee still while you trace. Make the letters as large as you can without feeling discomfort. Trace every letter from A to Z. Repeat 10 times. Complete this exercise 2 times a day. Strengthening exercises These exercises build strength and endurance in your ankle and lower leg. Endurance is the ability to use your muscles for a long time, even after they get tired. Ankle dorsiflexion   Secure a rubber exercise band or tube to an object, such as a table leg, that will stay still when the band is pulled. Secure the other end around your left / right foot. Sit on the floor facing the object, with your left / right leg extended. The band or tube should be slightly tense when your foot is relaxed. Slowly bring your foot toward you, bringing the top of your foot toward your shin (dorsiflexion), and pulling the band tighter. Hold this position for   10 seconds. Slowly return your foot to the starting position. Repeat 10 times. Complete this exercise 2 times a day. Ankle plantar flexion   Sit on the floor with your left / right leg extended. Loop a rubber exercise tube or band around the ball of your left / right foot. The ball of your foot is on the walking surface, right under your toes. Hold the ends of the band or tube in your hands. The band or tube should be slightly tense when your foot is relaxed. Slowly point your foot and toes downward to tilt the top of your foot away from your shin (plantar flexion). Hold this position for 10 seconds. Slowly return your foot to the starting position. Repeat 10 times. Complete this exercise 2 times a day. Ankle eversion Sit on the floor with your legs straight out in front of you. Loop a rubber exercise band or tube around the ball of your left / right foot. The ball of your foot is on the walking surface, right under your toes. Hold the ends of the band in your hands, or secure the band to a stable object. The band or tube should  be slightly tense when your foot is relaxed. Slowly push your foot outward, away from your other leg (eversion). Hold this position for 10 seconds. Slowly return your foot to the starting position. Repeat 10 times. Complete this exercise 2 times a day. This information is not intended to replace advice given to you by your health care provider. Make sure you discuss any questions you have with your health care provider. Document Revised: 01/11/2019 Document Reviewed: 07/05/2018 Elsevier Patient Education  2020 Elsevier Inc.  

## 2024-07-21 ENCOUNTER — Ambulatory Visit (INDEPENDENT_AMBULATORY_CARE_PROVIDER_SITE_OTHER): Admitting: Podiatry

## 2024-07-21 DIAGNOSIS — M79675 Pain in left toe(s): Secondary | ICD-10-CM

## 2024-07-21 DIAGNOSIS — M79674 Pain in right toe(s): Secondary | ICD-10-CM

## 2024-07-21 DIAGNOSIS — B351 Tinea unguium: Secondary | ICD-10-CM | POA: Diagnosis not present

## 2024-07-25 ENCOUNTER — Encounter: Payer: Self-pay | Admitting: Podiatry

## 2024-07-25 NOTE — Progress Notes (Signed)
  Subjective:  Patient ID: Louis Edwards, male    DOB: Jan 16, 1986,  MRN: [id number]  [facility] Ullman [facility] presents to clinic today for painful mycotic toenails x 10 which interfere with daily activities. Pain is relieved with periodic professional debridement.  Chief Complaint  Patient presents with   Toe Pain    RFC. NP Hatchett is his PCP. He is unsure of his last visit.    New problem(s): None.   PCP is [name], NP.  Allergies  Allergen Reactions   Risperidone  Other (See Comments)   Keflex [Cephalexin]    Thorazine [Chlorpromazine]     Review of Systems: Negative except as noted in the HPI.  Objective: No changes noted in today's physical examination. There were no vitals filed for this visit. Louis Edwards is a pleasant 38 y.o. male in NAD. AAO x 3.   Vascular Examination: Capillary refill time immediate b/l. Palpable pedal pulses. Pedal hair present b/l. Pedal edema absent. No pain with calf compression b/l. Skin temperature gradient WNL b/l. No cyanosis or clubbing b/l. No ischemia or gangrene noted b/l.   Neurological Examination: Sensation grossly intact b/l with 10 gram monofilament. Vibratory sensation intact b/l.   Dermatological Examination: Pedal skin with normal turgor, texture and tone b/l.  No open wounds. No interdigital macerations.   Toenails 1-5 b/l thick, discolored, elongated with subungual debris and pain on dorsal palpation.   No hyperkeratotic nor porokeratotic lesions.  Musculoskeletal Examination: Muscle strength 5/5 to all lower extremity muscle groups bilaterally. No pain, crepitus or joint limitation noted with ROM b/l LE. No gross bony pedal deformities b/l. Patient ambulates independently without assistive aids.  Radiographs: None  Assessment/Plan: 1. Pain due to onychomycosis of toenails of both feet   Patient was evaluated and treated. All patient's and/or POA's questions/concerns addressed on today's visit. Toenails 1-5 b/l  debrided in length and girth without incident. Continue soft, supportive shoe gear daily. Report any pedal injuries to medical professional. Call office if there are any questions/concerns. -Patient/POA to call should there be question/concern in the interim.   Return in about 3 months (around 10/21/2024).  [name], DPM      [facility] LOCATION: [address], [place] [address]                   Office [phone]   [facility] LOCATION: [address] [place], [place] [address] Office [phone]

## 2024-10-31 ENCOUNTER — Ambulatory Visit: Admitting: Podiatry
